# Patient Record
Sex: Male | Born: 1950 | Race: Black or African American | Hispanic: No | Smoking: Former smoker
Health system: Southern US, Community
[De-identification: ages and names within clinical notes are randomized; demographics above are authoritative.]

## PROBLEM LIST (undated history)

## (undated) DIAGNOSIS — E119 Type 2 diabetes mellitus without complications: Secondary | ICD-10-CM

## (undated) DIAGNOSIS — K219 Gastro-esophageal reflux disease without esophagitis: Secondary | ICD-10-CM

## (undated) DIAGNOSIS — M1990 Unspecified osteoarthritis, unspecified site: Secondary | ICD-10-CM

## (undated) DIAGNOSIS — H269 Unspecified cataract: Secondary | ICD-10-CM

## (undated) DIAGNOSIS — G709 Myoneural disorder, unspecified: Secondary | ICD-10-CM

## (undated) DIAGNOSIS — K635 Polyp of colon: Secondary | ICD-10-CM

## (undated) DIAGNOSIS — Z87442 Personal history of urinary calculi: Secondary | ICD-10-CM

## (undated) DIAGNOSIS — R519 Headache, unspecified: Secondary | ICD-10-CM

## (undated) DIAGNOSIS — K5790 Diverticulosis of intestine, part unspecified, without perforation or abscess without bleeding: Secondary | ICD-10-CM

## (undated) DIAGNOSIS — F419 Anxiety disorder, unspecified: Secondary | ICD-10-CM

## (undated) DIAGNOSIS — Z5189 Encounter for other specified aftercare: Secondary | ICD-10-CM

## (undated) DIAGNOSIS — R51 Headache: Secondary | ICD-10-CM

## (undated) DIAGNOSIS — T7840XA Allergy, unspecified, initial encounter: Secondary | ICD-10-CM

## (undated) DIAGNOSIS — I10 Essential (primary) hypertension: Secondary | ICD-10-CM

## (undated) HISTORY — DX: Diverticulosis of intestine, part unspecified, without perforation or abscess without bleeding: K57.90

## (undated) HISTORY — DX: Allergy, unspecified, initial encounter: T78.40XA

## (undated) HISTORY — DX: Encounter for other specified aftercare: Z51.89

## (undated) HISTORY — DX: Anxiety disorder, unspecified: F41.9

## (undated) HISTORY — DX: Polyp of colon: K63.5

## (undated) HISTORY — DX: Unspecified cataract: H26.9

## (undated) HISTORY — PX: FOOT SURGERY: SHX648

---
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
## 1997-10-26 HISTORY — PX: KIDNEY STONE SURGERY: SHX686

## 2017-12-21 ENCOUNTER — Ambulatory Visit: Payer: Self-pay | Admitting: Orthopedic Surgery

## 2017-12-22 NOTE — Patient Instructions (Addendum)
Jon Robinson.  12/22/2017   Your procedure is scheduled on: 12-30-17   Report to [facility] Main  Entrance Report to Admitting at 12:30 PM    Call this number if you have problems the morning of surgery [phone]   Remember: Do not eat food or drink liquids :After Midnight. You may have a Clear Liquid Diet from Midnight until 9:00 AM. After 9:00 AM, nothing until after mouth    CLEAR LIQUID DIET   Foods Allowed                                                                     Foods Excluded  Coffee and tea, regular and decaf                             liquids that you cannot  Plain Jell-O in any flavor                                             see through such as: Fruit ices (not with fruit pulp)                                     milk, soups, orange juice  Iced Popsicles                                    All solid food Carbonated beverages, regular and diet                                    Cranberry, grape and apple juices Sports drinks like Gatorade Lightly seasoned clear broth or consume(fat free) Sugar, honey syrup  Sample Menu Breakfast                                Lunch                                     Supper Cranberry juice                    Beef broth                            Chicken broth Jell-O                                     Grape juice                           Apple juice  Coffee or tea                        Jell-O                                      Popsicle                                                Coffee or tea                        Coffee or tea  _____________________________________________________________________      Take these medicines the morning of surgery with A SIP OF WATER: Loratadine (Allergy)                                You may not have any metal on your body including hair pins and              piercings  Do not wear jewelry, lotions, powders or deodorant             Men may shave face  and neck.   Do not bring valuables to the hospital. [place].  Contacts, dentures or bridgework may not be worn into surgery.  Leave suitcase in the car. After surgery it may be brought to your room.                  Please read over the following fact sheets you were given: _____________________________________________________________________          [facility] - Preparing for Surgery Before surgery, you can play an important role.  Because skin is not sterile, your skin needs to be as free of germs as possible.  You can reduce the number of germs on your skin by washing with CHG (chlorahexidine gluconate) soap before surgery.  CHG is an antiseptic cleaner which kills germs and bonds with the skin to continue killing germs even after washing. Please DO NOT use if you have an allergy to CHG or antibacterial soaps.  If your skin becomes reddened/irritated stop using the CHG and inform your nurse when you arrive at Short Stay. Do not shave (including legs and underarms) for at least 48 hours prior to the first CHG shower.  You may shave your face/neck. Please follow these instructions carefully:  1.  Shower with CHG Soap the night before surgery and the  morning of Surgery.  2.  If you choose to wash your hair, wash your hair first as usual with your  normal  shampoo.  3.  After you shampoo, rinse your hair and body thoroughly to remove the  shampoo.                           4.  Use CHG as you would any other liquid soap.  You can apply chg directly  to the skin and wash  Gently with a scrungie or clean washcloth.  5.  Apply the CHG Soap to your body ONLY FROM THE NECK DOWN.   Do not use on face/ open                           Wound or open sores. Avoid contact with eyes, ears mouth and genitals (private parts).                       Wash face,  Genitals (private parts) with your normal soap.             6.  Wash  thoroughly, paying special attention to the area where your surgery  will be performed.  7.  Thoroughly rinse your body with warm water from the neck down.  8.  DO NOT shower/wash with your normal soap after using and rinsing off  the CHG Soap.                9.  Pat yourself dry with a clean towel.            10.  Wear clean pajamas.            11.  Place clean sheets on your bed the night of your first shower and do not  sleep with pets. Day of Surgery : Do not apply any lotions/deodorants the morning of surgery.  Please wear clean clothes to the hospital/surgery center.  FAILURE TO FOLLOW THESE INSTRUCTIONS MAY RESULT IN THE CANCELLATION OF YOUR SURGERY PATIENT SIGNATURE_________________________________  NURSE SIGNATURE__________________________________  ________________________________________________________________________   Jon Robinson  An incentive spirometer is a tool that can help keep your lungs clear and active. This tool measures how well you are filling your lungs with each breath. Taking long deep breaths may help reverse or decrease the chance of developing breathing (pulmonary) problems (especially infection) following:  A long period of time when you are unable to move or be active. BEFORE THE PROCEDURE   If the spirometer includes an indicator to show your best effort, your nurse or respiratory therapist will set it to a desired goal.  If possible, sit up straight or lean slightly forward. Try not to slouch.  Hold the incentive spirometer in an upright position. INSTRUCTIONS FOR USE  1. Sit on the edge of your bed if possible, or sit up as far as you can in bed or on a chair. 2. Hold the incentive spirometer in an upright position. 3. Breathe out normally. 4. Place the mouthpiece in your mouth and seal your lips tightly around it. 5. Breathe in slowly and as deeply as possible, raising the piston or the ball toward the top of the column. 6. Hold your  breath for 3-5 seconds or for as long as possible. Allow the piston or ball to fall to the bottom of the column. 7. Remove the mouthpiece from your mouth and breathe out normally. 8. Rest for a few seconds and repeat Steps 1 through 7 at least 10 times every 1-2 hours when you are awake. Take your time and take a few normal breaths between deep breaths. 9. The spirometer may include an indicator to show your best effort. Use the indicator as a goal to work toward during each repetition. 10. After each set of 10 deep breaths, practice coughing to be sure your lungs are clear. If you have an incision (the cut made at the time of  surgery), support your incision when coughing by placing a pillow or rolled up towels firmly against it. Once you are able to get out of bed, walk around indoors and cough well. You may stop using the incentive spirometer when instructed by your caregiver.  RISKS AND COMPLICATIONS  Take your time so you do not get dizzy or light-headed.  If you are in pain, you may need to take or ask for pain medication before doing incentive spirometry. It is harder to take a deep breath if you are having pain. AFTER USE  Rest and breathe slowly and easily.  It can be helpful to keep track of a log of your progress. Your caregiver can provide you with a simple table to help with this. If you are using the spirometer at home, follow these instructions: [place] IF:   You are having difficultly using the spirometer.  You have trouble using the spirometer as often as instructed.  Your pain medication is not giving enough relief while using the spirometer.  You develop fever of 100.5 F (38.1 C) or higher. SEEK IMMEDIATE MEDICAL CARE IF:   You cough up bloody sputum that had not been present before.  You develop fever of 102 F (38.9 C) or greater.  You develop worsening pain at or near the incision site. MAKE SURE YOU:   Understand these instructions.  Will watch  your condition.  Will get help right away if you are not doing well or get worse. Document Released: 02/22/2007 Document Revised: 01/04/2012 Document Reviewed: 04/25/2007 Exit[employer] Patient Information 2014 [employer], [place].   ________________________________________________________________________  WHAT IS A BLOOD TRANSFUSION? Blood Transfusion Information  A transfusion is the replacement of blood or some of its parts. Blood is made up of multiple cells which provide different functions.  Red blood cells carry oxygen and are used for blood loss replacement.  White blood cells fight against infection.  Platelets control bleeding.  Plasma helps clot blood.  Other blood products are available for specialized needs, such as hemophilia or other clotting disorders. BEFORE THE TRANSFUSION  Who gives blood for transfusions?   Healthy volunteers who are fully evaluated to make sure their blood is safe. This is blood bank blood. Transfusion therapy is the safest it has ever been in the practice of medicine. Before blood is taken from a donor, a complete history is taken to make sure that person has no history of diseases nor engages in risky social behavior (examples are intravenous drug use or sexual activity with multiple partners). The donor's travel history is screened to minimize risk of transmitting infections, such as malaria. The donated blood is tested for signs of infectious diseases, such as HIV and hepatitis. The blood is then tested to be sure it is compatible with you in order to minimize the chance of a transfusion reaction. If you or a relative donates blood, this is often done in anticipation of surgery and is not appropriate for emergency situations. It takes many days to process the donated blood. RISKS AND COMPLICATIONS Although transfusion therapy is very safe and saves many lives, the main dangers of transfusion include:   Getting an infectious disease.  Developing a  transfusion reaction. This is an allergic reaction to something in the blood you were given. Every precaution is taken to prevent this. The decision to have a blood transfusion has been considered carefully by your caregiver before blood is given. Blood is not given unless the benefits outweigh the risks. AFTER THE  TRANSFUSION  Right after receiving a blood transfusion, you will usually feel much better and more energetic. This is especially true if your red blood cells have gotten low (anemic). The transfusion raises the level of the red blood cells which carry oxygen, and this usually causes an energy increase.  The nurse administering the transfusion will monitor you carefully for complications. HOME CARE INSTRUCTIONS  No special instructions are needed after a transfusion. You may find your energy is better. Speak with your caregiver about any limitations on activity for underlying diseases you may have. SEEK MEDICAL CARE IF:   Your condition is not improving after your transfusion.  You develop redness or irritation at the intravenous (IV) site. SEEK IMMEDIATE MEDICAL CARE IF:  Any of the following symptoms occur over the next 12 hours:  Shaking chills.  You have a temperature by mouth above 102 F (38.9 C), not controlled by medicine.  Chest, back, or muscle pain.  People around you feel you are not acting correctly or are confused.  Shortness of breath or difficulty breathing.  Dizziness and fainting.  You get a rash or develop hives.  You have a decrease in urine output.  Your urine turns a dark color or changes to pink, red, or brown. Any of the following symptoms occur over the next 10 days:  You have a temperature by mouth above 102 F (38.9 C), not controlled by medicine.  Shortness of breath.  Weakness after normal activity.  The white part of the eye turns yellow (jaundice).  You have a decrease in the amount of urine or are urinating less often.  Your  urine turns a dark color or changes to pink, red, or brown. Document Released: 10/09/2000 Document Revised: 01/04/2012 Document Reviewed: 05/28/2008 [facility] Patient Information 2014 [place], [place].  _______________________________________________________________________

## 2017-12-23 ENCOUNTER — Encounter (HOSPITAL_COMMUNITY)
Admission: RE | Admit: 2017-12-23 | Discharge: 2017-12-23 | Disposition: A | Discharge status: Home / Self Care | Payer: Non-veteran care | Source: Ambulatory Visit | Attending: Orthopedic Surgery | Admitting: Orthopedic Surgery

## 2017-12-23 ENCOUNTER — Other Ambulatory Visit: Payer: Self-pay

## 2017-12-23 ENCOUNTER — Encounter (HOSPITAL_COMMUNITY): Payer: Self-pay

## 2017-12-23 DIAGNOSIS — Z01812 Encounter for preprocedural laboratory examination: Secondary | ICD-10-CM | POA: Diagnosis not present

## 2017-12-23 DIAGNOSIS — Z01810 Encounter for preprocedural cardiovascular examination: Secondary | ICD-10-CM | POA: Diagnosis not present

## 2017-12-23 HISTORY — DX: Type 2 diabetes mellitus without complications: E11.9

## 2017-12-23 HISTORY — DX: Gastro-esophageal reflux disease without esophagitis: K21.9

## 2017-12-23 HISTORY — DX: Essential (primary) hypertension: I10

## 2017-12-23 HISTORY — DX: Personal history of urinary calculi: Z87.442

## 2017-12-23 HISTORY — DX: Unspecified osteoarthritis, unspecified site: M19.90

## 2017-12-23 HISTORY — DX: Headache: R51

## 2017-12-23 HISTORY — DX: Myoneural disorder, unspecified: G70.9

## 2017-12-23 HISTORY — DX: Headache, unspecified: R51.9

## 2017-12-23 LAB — ABO/RH: ABO/RH(D): O POS

## 2017-12-23 LAB — CBC
HEMATOCRIT: 40.9 % (ref 39.0–52.0)
Hemoglobin: 13 g/dL (ref 13.0–17.0)
MCH: 26.3 pg (ref 26.0–34.0)
MCHC: 31.8 g/dL (ref 30.0–36.0)
MCV: 82.8 fL (ref 78.0–100.0)
PLATELETS: 288 10*3/uL (ref 150–400)
RBC: 4.94 MIL/uL (ref 4.22–5.81)
RDW: 15.9 % — AB (ref 11.5–15.5)
WBC: 6.2 10*3/uL (ref 4.0–10.5)

## 2017-12-23 LAB — BASIC METABOLIC PANEL
Anion gap: 9 (ref 5–15)
BUN: 21 mg/dL — AB (ref 6–20)
CO2: 26 mmol/L (ref 22–32)
CREATININE: 1.16 mg/dL (ref 0.61–1.24)
Calcium: 9.3 mg/dL (ref 8.9–10.3)
Chloride: 104 mmol/L (ref 101–111)
GFR calc Af Amer: >60 mL/min (ref >60)
Glucose, Bld: 115 mg/dL — ABNORMAL HIGH (ref 65–99)
POTASSIUM: 4.1 mmol/L (ref 3.5–5.1)
SODIUM: 139 mmol/L (ref 135–145)

## 2017-12-23 LAB — SURGICAL PCR SCREEN
MRSA, PCR: NEGATIVE
STAPHYLOCOCCUS AUREUS: NEGATIVE

## 2017-12-23 LAB — HEMOGLOBIN A1C
Hgb A1c MFr Bld: 7.3 % — ABNORMAL HIGH (ref 4.8–5.6)
MEAN PLASMA GLUCOSE: 162.81 mg/dL

## 2017-12-23 NOTE — Progress Notes (Signed)
11-12-17 Surgical clearance from Dr. Beckey Downing on chart.

## 2017-12-24 ENCOUNTER — Ambulatory Visit: Payer: Self-pay | Admitting: Orthopedic Surgery

## 2017-12-24 NOTE — H&P (View-Only) (Signed)
TOTAL KNEE ADMISSION H&P  Patient is being admitted for right total knee arthroplasty.  Subjective:  Chief Complaint:right knee pain.  HPI: Jon Deloyd Handy., 67 y.o. male, has a history of pain and functional disability in the right knee due to arthritis and has failed non-surgical conservative treatments for greater than 12 weeks to includeNSAID's and/or analgesics, corticosteriod injections, flexibility and strengthening excercises, use of assistive devices, weight reduction as appropriate and activity modification.  Onset of symptoms was gradual, starting >10 years ago with gradually worsening course since that time. The patient noted no past surgery on the right knee(s).  Patient currently rates pain in the right knee(s) at 10 out of 10 with activity. Patient has night pain, worsening of pain with activity and weight bearing, pain that interferes with activities of daily living, pain with passive range of motion, crepitus and joint swelling.  Patient has evidence of subchondral cysts, subchondral sclerosis, periarticular osteophytes and joint space narrowing by imaging studies. There is no active infection.  There are no active problems to display for this patient.  Past Medical History:  Diagnosis Date  . Arthritis   . Diabetes mellitus without complication ([place])    Diet controlled  . GERD (gastroesophageal reflux disease)   . Headache   . History of kidney stones   . Hypertension   . Neuromuscular disorder ([place])    Finger tips numbness/tingling    Past Surgical History:  Procedure Laterality Date  . FRACTURE SURGERY  1972   Wrist   . KIDNEY STONE SURGERY  10/1997    No current facility-administered medications for this visit.    No current outpatient medications on file.   Facility-Administered Medications Ordered in Other Visits  Medication Dose Route Frequency Provider Last Rate Last Dose  . 0.9 %  sodium chloride infusion   Intravenous Continuous Mianna Iezzi, Aaron Edelman, MD       . acetaminophen (OFIRMEV) IV 1,000 mg  1,000 mg Intravenous To OR Nerea Bordenave, Aaron Edelman, MD      . chlorhexidine (HIBICLENS) 4 % liquid 4 application  60 mL Topical Once Itzia Cunliffe, Aaron Edelman, MD      . clindamycin (CLEOCIN) IVPB 900 mg  900 mg Intravenous On Call to [place], MD      . fentaNYL (SUBLIMAZE) injection 50-100 mcg  50-100 mcg Intravenous Madilyn Hook, MD   100 mcg at 12/30/17 1321  . lactated ringers infusion   Intravenous Continuous Roderic Palau, MD 20 mL/hr at 12/30/17 1303    . midazolam (VERSED) injection 1-2 mg  1-2 mg Intravenous Madilyn Hook, MD   2 mg at 12/30/17 1321  . ropivacaine (PF) 7.5 mg/mL (0.75%) (NAROPIN) injection   Peri-NEURAL Anesthesia Intra-op Roderic Palau, MD   20 mL at 12/30/17 1322  . tranexamic acid (CYKLOKAPRON) 1,000 mg in sodium chloride 0.9 % 100 mL IVPB  1,000 mg Intravenous To OR Ritesh Opara, Aaron Edelman, MD       Allergies  Allergen Reactions  . Novocain [Procaine] Swelling    In the face  . Penicillins Other (See Comments)    Social History   Tobacco Use  . Smoking status: Former Smoker    Packs/day: 0.50    Years: 25.00    Pack years: 12.50    Types: Cigarettes    Last attempt to quit: 12/28/2016    Years since quitting: 1.0  . Smokeless tobacco: Never Used  Substance Use Topics  . Alcohol use: Yes    Alcohol/week: 1.2 - 1.8 oz  Types: 1 Shots of liquor, 1 - 2 Cans of beer per week    Comment: Weekly    No family history on file.   Review of Systems  Constitutional: Positive for diaphoresis.  HENT: Negative.   Eyes: Negative.   Respiratory: Negative.   Cardiovascular: Negative.   Gastrointestinal: Positive for diarrhea.  Genitourinary: Positive for urgency.  Musculoskeletal: Positive for back pain and joint pain.  Skin: Negative.   Neurological: Negative.   Endo/Heme/Allergies: Positive for environmental allergies.  Psychiatric/Behavioral: The patient has insomnia.     Objective:  Physical  Exam  Vitals reviewed. Constitutional: He is oriented to person, place, and time. He appears well-developed and well-nourished.  HENT:  Head: Normocephalic and atraumatic.  Eyes: Conjunctivae and EOM are normal. Pupils are equal, round, and reactive to light.  Neck: Normal range of motion. Neck supple.  Cardiovascular: Normal rate and intact distal pulses.  Respiratory: Effort normal. No respiratory distress.  GI: Soft. He exhibits no distension.  Genitourinary:  Genitourinary Comments: deferred  Musculoskeletal:       Right knee: He exhibits decreased range of motion, swelling, effusion, abnormal alignment and bony tenderness. Tenderness found. Medial joint line and lateral joint line tenderness noted.  Neurological: He is alert and oriented to person, place, and time. He has normal reflexes.  Skin: Skin is warm and dry.  Psychiatric: He has a normal mood and affect. His behavior is normal. Judgment and thought content normal.    Vital signs in last 24 hours: @VSRANGES @  Labs:   Estimated body mass index is 31.6 kg/m as calculated from the following:   Height as of 12/30/17: 5\' 9"  (1.753 m).   Weight as of 12/30/17: 97.1 kg (214 lb).   Imaging Review Plain radiographs demonstrate severe degenerative joint disease of the right knee(s). The overall alignment issignificant valgus. The bone quality appears to be adequate for age and reported activity level.  Assessment/Plan:  End stage arthritis, right knee   The patient history, physical examination, clinical judgment of the provider and imaging studies are consistent with end stage degenerative joint disease of the right knee(s) and total knee arthroplasty is deemed medically necessary. The treatment options including medical management, injection therapy arthroscopy and arthroplasty were discussed at length. The risks and benefits of total knee arthroplasty were presented and reviewed. The risks due to aseptic loosening, infection,  stiffness, patella tracking problems, thromboembolic complications and other imponderables were discussed. The patient acknowledged the explanation, agreed to proceed with the plan and consent was signed. Patient is being admitted for inpatient treatment for surgery, pain control, PT, OT, prophylactic antibiotics, VTE prophylaxis, progressive ambulation and ADL's and discharge planning. The patient is planning to be discharged home with home health services

## 2017-12-24 NOTE — H&P (Signed)
TOTAL KNEE ADMISSION H&P  Patient is being admitted for right total knee arthroplasty.  Subjective:  Chief Complaint:right knee pain.  HPI: Jon Robinson., 67 y.o. male, has a history of pain and functional disability in the right knee due to arthritis and has failed non-surgical conservative treatments for greater than 12 weeks to includeNSAID's and/or analgesics, corticosteriod injections, flexibility and strengthening excercises, use of assistive devices, weight reduction as appropriate and activity modification.  Onset of symptoms was gradual, starting >10 years ago with gradually worsening course since that time. The patient noted no past surgery on the right knee(s).  Patient currently rates pain in the right knee(s) at 10 out of 10 with activity. Patient has night pain, worsening of pain with activity and weight bearing, pain that interferes with activities of daily living, pain with passive range of motion, crepitus and joint swelling.  Patient has evidence of subchondral cysts, subchondral sclerosis, periarticular osteophytes and joint space narrowing by imaging studies. There is no active infection.  There are no active problems to display for this patient.  Past Medical History:  Diagnosis Date  . Arthritis   . Diabetes mellitus without complication ([place])    Diet controlled  . GERD (gastroesophageal reflux disease)   . Headache   . History of kidney stones   . Hypertension   . Neuromuscular disorder ([place])    Finger tips numbness/tingling    Past Surgical History:  Procedure Laterality Date  . FRACTURE SURGERY  1972   Wrist   . KIDNEY STONE SURGERY  10/1997    No current facility-administered medications for this visit.    No current outpatient medications on file.   Facility-Administered Medications Ordered in Other Visits  Medication Dose Route Frequency Provider Last Rate Last Dose  . 0.9 %  sodium chloride infusion   Intravenous Continuous Talal Fritchman, Aaron Edelman, MD       . acetaminophen (OFIRMEV) IV 1,000 mg  1,000 mg Intravenous To OR Merced Brougham, Aaron Edelman, MD      . chlorhexidine (HIBICLENS) 4 % liquid 4 application  60 mL Topical Once Jalon Blackwelder, Aaron Edelman, MD      . clindamycin (CLEOCIN) IVPB 900 mg  900 mg Intravenous On Call to [place], MD      . fentaNYL (SUBLIMAZE) injection 50-100 mcg  50-100 mcg Intravenous Madilyn Hook, MD   100 mcg at 12/30/17 1321  . lactated ringers infusion   Intravenous Continuous Roderic Palau, MD 20 mL/hr at 12/30/17 1303    . midazolam (VERSED) injection 1-2 mg  1-2 mg Intravenous Madilyn Hook, MD   2 mg at 12/30/17 1321  . ropivacaine (PF) 7.5 mg/mL (0.75%) (NAROPIN) injection   Peri-NEURAL Anesthesia Intra-op Roderic Palau, MD   20 mL at 12/30/17 1322  . tranexamic acid (CYKLOKAPRON) 1,000 mg in sodium chloride 0.9 % 100 mL IVPB  1,000 mg Intravenous To OR Aniko Finnigan, Aaron Edelman, MD       Allergies  Allergen Reactions  . Novocain [Procaine] Swelling    In the face  . Penicillins Other (See Comments)    Social History   Tobacco Use  . Smoking status: Former Smoker    Packs/day: 0.50    Years: 25.00    Pack years: 12.50    Types: Cigarettes    Last attempt to quit: 12/28/2016    Years since quitting: 1.0  . Smokeless tobacco: Never Used  Substance Use Topics  . Alcohol use: Yes    Alcohol/week: 1.2 - 1.8 oz  Types: 1 Shots of liquor, 1 - 2 Cans of beer per week    Comment: Weekly    No family history on file.   Review of Systems  Constitutional: Positive for diaphoresis.  HENT: Negative.   Eyes: Negative.   Respiratory: Negative.   Cardiovascular: Negative.   Gastrointestinal: Positive for diarrhea.  Genitourinary: Positive for urgency.  Musculoskeletal: Positive for back pain and joint pain.  Skin: Negative.   Neurological: Negative.   Endo/Heme/Allergies: Positive for environmental allergies.  Psychiatric/Behavioral: The patient has insomnia.     Objective:  Physical  Exam  Vitals reviewed. Constitutional: He is oriented to person, place, and time. He appears well-developed and well-nourished.  HENT:  Head: Normocephalic and atraumatic.  Eyes: Conjunctivae and EOM are normal. Pupils are equal, round, and reactive to light.  Neck: Normal range of motion. Neck supple.  Cardiovascular: Normal rate and intact distal pulses.  Respiratory: Effort normal. No respiratory distress.  GI: Soft. He exhibits no distension.  Genitourinary:  Genitourinary Comments: deferred  Musculoskeletal:       Right knee: He exhibits decreased range of motion, swelling, effusion, abnormal alignment and bony tenderness. Tenderness found. Medial joint line and lateral joint line tenderness noted.  Neurological: He is alert and oriented to person, place, and time. He has normal reflexes.  Skin: Skin is warm and dry.  Psychiatric: He has a normal mood and affect. His behavior is normal. Judgment and thought content normal.    Vital signs in last 24 hours: @VSRANGES @  Labs:   Estimated body mass index is 31.6 kg/m as calculated from the following:   Height as of 12/30/17: 5\' 9"  (1.753 m).   Weight as of 12/30/17: 97.1 kg (214 lb).   Imaging Review Plain radiographs demonstrate severe degenerative joint disease of the right knee(s). The overall alignment issignificant valgus. The bone quality appears to be adequate for age and reported activity level.  Assessment/Plan:  End stage arthritis, right knee   The patient history, physical examination, clinical judgment of the provider and imaging studies are consistent with end stage degenerative joint disease of the right knee(s) and total knee arthroplasty is deemed medically necessary. The treatment options including medical management, injection therapy arthroscopy and arthroplasty were discussed at length. The risks and benefits of total knee arthroplasty were presented and reviewed. The risks due to aseptic loosening, infection,  stiffness, patella tracking problems, thromboembolic complications and other imponderables were discussed. The patient acknowledged the explanation, agreed to proceed with the plan and consent was signed. Patient is being admitted for inpatient treatment for surgery, pain control, PT, OT, prophylactic antibiotics, VTE prophylaxis, progressive ambulation and ADL's and discharge planning. The patient is planning to be discharged home with home health services

## 2017-12-27 NOTE — Progress Notes (Signed)
Final ekg in epic 

## 2017-12-29 MED ORDER — TRANEXAMIC ACID 1000 MG/10ML IV SOLN
1000.0000 mg | INTRAVENOUS | Status: AC
  Administered 2017-12-30: 1000 mg via INTRAVENOUS
  Filled 2017-12-29: qty 1100

## 2017-12-30 ENCOUNTER — Inpatient Hospital Stay (HOSPITAL_COMMUNITY)
Admission: RE | Admit: 2017-12-30 | Discharge: 2018-01-01 | DRG: 470 | Disposition: A | Discharge status: Home / Self Care | Payer: Non-veteran care | Source: Ambulatory Visit | Attending: Orthopedic Surgery | Admitting: Orthopedic Surgery

## 2017-12-30 ENCOUNTER — Inpatient Hospital Stay (HOSPITAL_COMMUNITY): Payer: Non-veteran care

## 2017-12-30 ENCOUNTER — Inpatient Hospital Stay (HOSPITAL_COMMUNITY): Payer: Non-veteran care | Admitting: Certified Registered Nurse Anesthetist

## 2017-12-30 ENCOUNTER — Encounter (HOSPITAL_COMMUNITY)
Admission: RE | Disposition: A | Discharge status: Home / Self Care | Payer: Self-pay | Source: Ambulatory Visit | Attending: Orthopedic Surgery

## 2017-12-30 ENCOUNTER — Encounter (HOSPITAL_COMMUNITY): Payer: Self-pay | Admitting: *Deleted

## 2017-12-30 ENCOUNTER — Other Ambulatory Visit: Payer: Self-pay

## 2017-12-30 DIAGNOSIS — I10 Essential (primary) hypertension: Secondary | ICD-10-CM | POA: Diagnosis present

## 2017-12-30 DIAGNOSIS — E119 Type 2 diabetes mellitus without complications: Secondary | ICD-10-CM | POA: Diagnosis present

## 2017-12-30 DIAGNOSIS — Z884 Allergy status to anesthetic agent status: Secondary | ICD-10-CM

## 2017-12-30 DIAGNOSIS — K219 Gastro-esophageal reflux disease without esophagitis: Secondary | ICD-10-CM | POA: Diagnosis present

## 2017-12-30 DIAGNOSIS — M1711 Unilateral primary osteoarthritis, right knee: Secondary | ICD-10-CM | POA: Diagnosis present

## 2017-12-30 DIAGNOSIS — Z880 Allergy status to penicillin: Secondary | ICD-10-CM | POA: Diagnosis not present

## 2017-12-30 DIAGNOSIS — Z87891 Personal history of nicotine dependence: Secondary | ICD-10-CM

## 2017-12-30 DIAGNOSIS — M659 Synovitis and tenosynovitis, unspecified: Secondary | ICD-10-CM | POA: Diagnosis present

## 2017-12-30 DIAGNOSIS — Z96651 Presence of right artificial knee joint: Secondary | ICD-10-CM

## 2017-12-30 HISTORY — PX: KNEE ARTHROPLASTY: SHX992

## 2017-12-30 LAB — GLUCOSE, CAPILLARY
GLUCOSE-CAPILLARY: 206 mg/dL — AB (ref 65–99)
Glucose-Capillary: 132 mg/dL — ABNORMAL HIGH (ref 65–99)

## 2017-12-30 LAB — TYPE AND SCREEN
ABO/RH(D): O POS
Antibody Screen: NEGATIVE

## 2017-12-30 SURGERY — ARTHROPLASTY, KNEE, TOTAL, USING IMAGELESS COMPUTER-ASSISTED NAVIGATION
Anesthesia: Regional | Site: Knee | Laterality: Right

## 2017-12-30 MED ORDER — KETOROLAC TROMETHAMINE 30 MG/ML IJ SOLN
INTRAMUSCULAR | Status: AC
  Filled 2017-12-30: qty 1

## 2017-12-30 MED ORDER — ACETAMINOPHEN 10 MG/ML IV SOLN
1000.0000 mg | INTRAVENOUS | Status: AC
  Administered 2017-12-30: 1000 mg via INTRAVENOUS
  Filled 2017-12-30: qty 100

## 2017-12-30 MED ORDER — SODIUM CHLORIDE 0.9 % IV SOLN: INTRAVENOUS | Status: DC

## 2017-12-30 MED ORDER — OXYCODONE HCL 5 MG PO TABS
10.0000 mg | ORAL_TABLET | ORAL | Status: DC | PRN
  Administered 2018-01-01: 12:00:00 15 mg via ORAL
  Administered 2018-01-01: 10 mg via ORAL
  Filled 2017-12-30 (×2): qty 2
  Filled 2017-12-30: qty 3

## 2017-12-30 MED ORDER — LABETALOL HCL 5 MG/ML IV SOLN
5.0000 mg | INTRAVENOUS | Status: DC | PRN
  Administered 2017-12-30 (×3): 5 mg via INTRAVENOUS

## 2017-12-30 MED ORDER — LABETALOL HCL 5 MG/ML IV SOLN
INTRAVENOUS | Status: AC
  Administered 2017-12-30: 5 mg via INTRAVENOUS
  Filled 2017-12-30: qty 4

## 2017-12-30 MED ORDER — DEXAMETHASONE SODIUM PHOSPHATE 10 MG/ML IJ SOLN
10.0000 mg | Freq: Once | INTRAMUSCULAR | Status: AC
  Administered 2017-12-31: 10 mg via INTRAVENOUS
  Filled 2017-12-30: qty 1

## 2017-12-30 MED ORDER — HYDROCHLOROTHIAZIDE 25 MG PO TABS
25.0000 mg | ORAL_TABLET | Freq: Every day | ORAL | Status: DC
  Administered 2017-12-31 – 2018-01-01 (×2): 25 mg via ORAL
  Filled 2017-12-30 (×2): qty 1

## 2017-12-30 MED ORDER — ONDANSETRON HCL 4 MG/2ML IJ SOLN
INTRAMUSCULAR | Status: DC | PRN
  Administered 2017-12-30 (×2): 4 mg via INTRAVENOUS

## 2017-12-30 MED ORDER — FENTANYL CITRATE (PF) 100 MCG/2ML IJ SOLN
INTRAMUSCULAR | Status: DC | PRN
  Administered 2017-12-30 (×2): 25 ug via INTRAVENOUS
  Administered 2017-12-30 (×3): 50 ug via INTRAVENOUS

## 2017-12-30 MED ORDER — MUSCLE RUB 10-15 % EX CREA
TOPICAL_CREAM | Freq: Two times a day (BID) | CUTANEOUS | Status: DC | PRN
  Filled 2017-12-30: qty 85

## 2017-12-30 MED ORDER — DIPHENHYDRAMINE HCL 12.5 MG/5ML PO ELIX: 12.5000 mg | ORAL_SOLUTION | ORAL | Status: DC | PRN

## 2017-12-30 MED ORDER — CLINDAMYCIN PHOSPHATE 900 MG/50ML IV SOLN
900.0000 mg | INTRAVENOUS | Status: AC
  Administered 2017-12-30: 900 mg via INTRAVENOUS
  Filled 2017-12-30: qty 50

## 2017-12-30 MED ORDER — POLYETHYLENE GLYCOL 3350 17 G PO PACK: 17.0000 g | PACK | Freq: Every day | ORAL | Status: DC | PRN

## 2017-12-30 MED ORDER — KETAMINE HCL 10 MG/ML IJ SOLN
INTRAMUSCULAR | Status: DC | PRN
  Administered 2017-12-30 (×2): 20 mg via INTRAVENOUS
  Administered 2017-12-30: 30 mg via INTRAVENOUS

## 2017-12-30 MED ORDER — BUPIVACAINE-EPINEPHRINE 0.25% -1:200000 IJ SOLN
INTRAMUSCULAR | Status: DC | PRN
  Administered 2017-12-30: 30 mL

## 2017-12-30 MED ORDER — KETOROLAC TROMETHAMINE 30 MG/ML IJ SOLN
INTRAMUSCULAR | Status: DC | PRN
  Administered 2017-12-30: 30 mg via INTRA_ARTICULAR

## 2017-12-30 MED ORDER — HYDROMORPHONE HCL 1 MG/ML IJ SOLN
INTRAMUSCULAR | Status: DC | PRN
  Administered 2017-12-30 (×4): 0.5 mg via INTRAVENOUS

## 2017-12-30 MED ORDER — VITAMIN C 500 MG PO TABS
1000.0000 mg | ORAL_TABLET | Freq: Every day | ORAL | Status: DC
  Administered 2017-12-31 – 2018-01-01 (×2): 1000 mg via ORAL
  Filled 2017-12-30 (×2): qty 2

## 2017-12-30 MED ORDER — ONDANSETRON HCL 4 MG PO TABS: 4.0000 mg | ORAL_TABLET | Freq: Four times a day (QID) | ORAL | Status: DC | PRN

## 2017-12-30 MED ORDER — OXYCODONE HCL 5 MG PO TABS
5.0000 mg | ORAL_TABLET | ORAL | Status: DC | PRN
  Administered 2017-12-31 – 2018-01-01 (×3): 10 mg via ORAL
  Filled 2017-12-30 (×2): qty 2

## 2017-12-30 MED ORDER — HYDROMORPHONE HCL 1 MG/ML IJ SOLN
INTRAMUSCULAR | Status: AC
  Administered 2017-12-30: 0.5 mg via INTRAVENOUS
  Filled 2017-12-30: qty 2

## 2017-12-30 MED ORDER — SODIUM CHLORIDE 0.9 % IJ SOLN
INTRAMUSCULAR | Status: AC
  Filled 2017-12-30: qty 50

## 2017-12-30 MED ORDER — HYDRALAZINE HCL 20 MG/ML IJ SOLN
INTRAMUSCULAR | Status: AC
  Administered 2017-12-30: 5 mg via INTRAVENOUS
  Filled 2017-12-30: qty 1

## 2017-12-30 MED ORDER — HYDRALAZINE HCL 20 MG/ML IJ SOLN
5.0000 mg | Freq: Once | INTRAMUSCULAR | Status: AC
  Administered 2017-12-30: 5 mg via INTRAVENOUS

## 2017-12-30 MED ORDER — KETOROLAC TROMETHAMINE 15 MG/ML IJ SOLN
15.0000 mg | Freq: Four times a day (QID) | INTRAMUSCULAR | Status: AC
  Administered 2017-12-30 – 2017-12-31 (×4): 15 mg via INTRAVENOUS
  Filled 2017-12-30 (×4): qty 1

## 2017-12-30 MED ORDER — METOCLOPRAMIDE HCL 5 MG/ML IJ SOLN: 5.0000 mg | Freq: Three times a day (TID) | INTRAMUSCULAR | Status: DC | PRN

## 2017-12-30 MED ORDER — SUCCINYLCHOLINE CHLORIDE 20 MG/ML IJ SOLN
INTRAMUSCULAR | Status: DC | PRN
  Administered 2017-12-30: 100 mg via INTRAVENOUS

## 2017-12-30 MED ORDER — HYDROMORPHONE HCL 2 MG/ML IJ SOLN
INTRAMUSCULAR | Status: AC
  Filled 2017-12-30: qty 1

## 2017-12-30 MED ORDER — BUPIVACAINE HCL (PF) 0.25 % IJ SOLN
INTRAMUSCULAR | Status: AC
  Filled 2017-12-30: qty 30

## 2017-12-30 MED ORDER — FENTANYL CITRATE (PF) 100 MCG/2ML IJ SOLN
50.0000 ug | INTRAMUSCULAR | Status: DC
  Administered 2017-12-30: 100 ug via INTRAVENOUS
  Filled 2017-12-30: qty 2

## 2017-12-30 MED ORDER — SODIUM CHLORIDE 0.9 % IJ SOLN
INTRAMUSCULAR | Status: DC | PRN
  Administered 2017-12-30: 30 mL

## 2017-12-30 MED ORDER — ISOPROPYL ALCOHOL 70 % SOLN
Status: DC | PRN
  Administered 2017-12-30: 1 via TOPICAL

## 2017-12-30 MED ORDER — KETAMINE HCL 10 MG/ML IJ SOLN
INTRAMUSCULAR | Status: AC
  Filled 2017-12-30: qty 1

## 2017-12-30 MED ORDER — MENTHOL 3 MG MT LOZG: 1.0000 | LOZENGE | OROMUCOSAL | Status: DC | PRN

## 2017-12-30 MED ORDER — ALBUTEROL SULFATE HFA 108 (90 BASE) MCG/ACT IN AERS
INHALATION_SPRAY | RESPIRATORY_TRACT | Status: DC | PRN
  Administered 2017-12-30: 2 via RESPIRATORY_TRACT

## 2017-12-30 MED ORDER — PHENOL 1.4 % MT LIQD
1.0000 | OROMUCOSAL | Status: DC | PRN
  Filled 2017-12-30: qty 177

## 2017-12-30 MED ORDER — HYDROMORPHONE HCL 1 MG/ML IJ SOLN
0.2500 mg | INTRAMUSCULAR | Status: DC | PRN
  Administered 2017-12-30 (×2): 0.5 mg via INTRAVENOUS

## 2017-12-30 MED ORDER — FENTANYL CITRATE (PF) 250 MCG/5ML IJ SOLN
INTRAMUSCULAR | Status: AC
  Filled 2017-12-30: qty 5

## 2017-12-30 MED ORDER — DEXAMETHASONE SODIUM PHOSPHATE 10 MG/ML IJ SOLN
INTRAMUSCULAR | Status: AC
  Filled 2017-12-30: qty 1

## 2017-12-30 MED ORDER — ONDANSETRON HCL 4 MG/2ML IJ SOLN
INTRAMUSCULAR | Status: AC
  Filled 2017-12-30: qty 2

## 2017-12-30 MED ORDER — ALUM & MAG HYDROXIDE-SIMETH 200-200-20 MG/5ML PO SUSP: 30.0000 mL | ORAL | Status: DC | PRN

## 2017-12-30 MED ORDER — DOCUSATE SODIUM 100 MG PO CAPS
100.0000 mg | ORAL_CAPSULE | Freq: Two times a day (BID) | ORAL | Status: DC
  Administered 2017-12-30 – 2018-01-01 (×4): 100 mg via ORAL
  Filled 2017-12-30 (×4): qty 1

## 2017-12-30 MED ORDER — PROPOFOL 10 MG/ML IV BOLUS
INTRAVENOUS | Status: DC | PRN
  Administered 2017-12-30 (×2): 30 mg via INTRAVENOUS
  Administered 2017-12-30: 200 mg via INTRAVENOUS
  Administered 2017-12-30 (×2): 20 mg via INTRAVENOUS

## 2017-12-30 MED ORDER — LACTATED RINGERS IV SOLN
INTRAVENOUS | Status: DC
  Administered 2017-12-30 (×2): via INTRAVENOUS

## 2017-12-30 MED ORDER — MIDAZOLAM HCL 2 MG/2ML IJ SOLN
1.0000 mg | INTRAMUSCULAR | Status: DC
  Administered 2017-12-30: 2 mg via INTRAVENOUS
  Filled 2017-12-30: qty 2

## 2017-12-30 MED ORDER — SODIUM CHLORIDE 0.9 % IR SOLN
Status: DC | PRN
  Administered 2017-12-30: 1000 mL

## 2017-12-30 MED ORDER — SODIUM CHLORIDE 0.9 % IV SOLN
INTRAVENOUS | Status: DC
  Administered 2017-12-30: 22:00:00 via INTRAVENOUS

## 2017-12-30 MED ORDER — PROPOFOL 10 MG/ML IV BOLUS
INTRAVENOUS | Status: AC
  Filled 2017-12-30: qty 40

## 2017-12-30 MED ORDER — METOCLOPRAMIDE HCL 5 MG PO TABS: 5.0000 mg | ORAL_TABLET | Freq: Three times a day (TID) | ORAL | Status: DC | PRN

## 2017-12-30 MED ORDER — ONDANSETRON HCL 4 MG/2ML IJ SOLN: 4.0000 mg | Freq: Four times a day (QID) | INTRAMUSCULAR | Status: DC | PRN

## 2017-12-30 MED ORDER — ACETAMINOPHEN 325 MG PO TABS: 325.0000 mg | ORAL_TABLET | Freq: Four times a day (QID) | ORAL | Status: DC | PRN

## 2017-12-30 MED ORDER — ISOPROPYL ALCOHOL 70 % SOLN
Status: AC
  Filled 2017-12-30: qty 480

## 2017-12-30 MED ORDER — POVIDONE-IODINE 10 % EX SWAB
2.0000 "application " | Freq: Once | CUTANEOUS | Status: AC
  Administered 2017-12-30: 2 via TOPICAL

## 2017-12-30 MED ORDER — METHOCARBAMOL 500 MG PO TABS
500.0000 mg | ORAL_TABLET | Freq: Four times a day (QID) | ORAL | Status: DC | PRN
  Filled 2017-12-30: qty 1

## 2017-12-30 MED ORDER — PHENYLEPHRINE HCL 10 MG/ML IJ SOLN
INTRAMUSCULAR | Status: DC | PRN
  Administered 2017-12-30 (×5): 80 ug via INTRAVENOUS
  Administered 2017-12-30: 40 ug via INTRAVENOUS

## 2017-12-30 MED ORDER — CLINDAMYCIN PHOSPHATE 600 MG/50ML IV SOLN
600.0000 mg | Freq: Four times a day (QID) | INTRAVENOUS | Status: AC
  Administered 2017-12-30 – 2017-12-31 (×2): 600 mg via INTRAVENOUS
  Filled 2017-12-30 (×2): qty 50

## 2017-12-30 MED ORDER — TROLAMINE SALICYLATE 10 % EX CREA
TOPICAL_CREAM | Freq: Two times a day (BID) | CUTANEOUS | Status: DC | PRN
  Filled 2017-12-30: qty 85

## 2017-12-30 MED ORDER — HYDROMORPHONE HCL 1 MG/ML IJ SOLN: 0.5000 mg | INTRAMUSCULAR | Status: DC | PRN

## 2017-12-30 MED ORDER — METHOCARBAMOL 1000 MG/10ML IJ SOLN
500.0000 mg | Freq: Four times a day (QID) | INTRAMUSCULAR | Status: DC | PRN
  Administered 2017-12-30: 500 mg via INTRAVENOUS
  Filled 2017-12-30: qty 550

## 2017-12-30 MED ORDER — METHOCARBAMOL 500 MG PO TABS
500.0000 mg | ORAL_TABLET | Freq: Four times a day (QID) | ORAL | Status: DC | PRN
  Administered 2018-01-01: 500 mg via ORAL

## 2017-12-30 MED ORDER — VITAMIN B-6 100 MG PO TABS
200.0000 mg | ORAL_TABLET | Freq: Every day | ORAL | Status: DC
  Administered 2017-12-31 – 2018-01-01 (×2): 200 mg via ORAL
  Filled 2017-12-30 (×3): qty 2

## 2017-12-30 MED ORDER — TROLAMINE SALICYLATE 10 % EX LOTN: 1.0000 "application " | TOPICAL_LOTION | Freq: Two times a day (BID) | CUTANEOUS | Status: DC | PRN

## 2017-12-30 MED ORDER — DEXAMETHASONE SODIUM PHOSPHATE 10 MG/ML IJ SOLN
INTRAMUSCULAR | Status: DC | PRN
  Administered 2017-12-30: 10 mg via INTRAVENOUS

## 2017-12-30 MED ORDER — LORATADINE 10 MG PO TABS
10.0000 mg | ORAL_TABLET | Freq: Every day | ORAL | Status: DC
  Administered 2017-12-31 – 2018-01-01 (×2): 10 mg via ORAL
  Filled 2017-12-30 (×2): qty 1

## 2017-12-30 MED ORDER — ROPIVACAINE HCL 7.5 MG/ML IJ SOLN
INTRAMUSCULAR | Status: DC | PRN
  Administered 2017-12-30: 20 mL via PERINEURAL

## 2017-12-30 MED ORDER — ASPIRIN 81 MG PO CHEW
81.0000 mg | CHEWABLE_TABLET | Freq: Two times a day (BID) | ORAL | Status: DC
  Administered 2017-12-30 – 2018-01-01 (×4): 81 mg via ORAL
  Filled 2017-12-30 (×4): qty 1

## 2017-12-30 MED ORDER — OXYCODONE HCL ER 10 MG PO T12A
10.0000 mg | EXTENDED_RELEASE_TABLET | Freq: Two times a day (BID) | ORAL | Status: DC
  Administered 2017-12-30 – 2018-01-01 (×4): 10 mg via ORAL
  Filled 2017-12-30 (×4): qty 1

## 2017-12-30 MED ORDER — CHLORHEXIDINE GLUCONATE 4 % EX LIQD: 60.0000 mL | Freq: Once | CUTANEOUS | Status: DC

## 2017-12-30 SURGICAL SUPPLY — 59 items
BAG ZIPLOCK 12X15 (MISCELLANEOUS) IMPLANT
BANDAGE ACE 4X5 VEL STRL LF (GAUZE/BANDAGES/DRESSINGS) ×2 IMPLANT
BANDAGE ACE 6X5 VEL STRL LF (GAUZE/BANDAGES/DRESSINGS) ×2 IMPLANT
BLADE SAW RECIPROCATING 77.5 (BLADE) ×2 IMPLANT
CHLORAPREP W/TINT 26ML (MISCELLANEOUS) ×4 IMPLANT
COMPONENT TRI CR RETAIN SZ6 RT (Miscellaneous) ×1 IMPLANT
COVER SURGICAL LIGHT HANDLE (MISCELLANEOUS) ×2 IMPLANT
CUFF TOURN SGL QUICK 34 (TOURNIQUET CUFF) ×1
CUFF TRNQT CYL 34X4X40X1 (TOURNIQUET CUFF) ×1 IMPLANT
DECANTER SPIKE VIAL GLASS SM (MISCELLANEOUS) ×4 IMPLANT
DERMABOND ADVANCED (GAUZE/BANDAGES/DRESSINGS) ×2
DERMABOND ADVANCED .7 DNX12 (GAUZE/BANDAGES/DRESSINGS) ×2 IMPLANT
DRAPE SHEET LG 3/4 BI-LAMINATE (DRAPES) ×4 IMPLANT
DRAPE U-SHAPE 47X51 STRL (DRAPES) ×2 IMPLANT
DRSG AQUACEL AG ADV 3.5X10 (GAUZE/BANDAGES/DRESSINGS) ×2 IMPLANT
DRSG AQUACEL AG ADV 3.5X14 (GAUZE/BANDAGES/DRESSINGS) ×2 IMPLANT
DRSG TEGADERM 4X4.75 (GAUZE/BANDAGES/DRESSINGS) IMPLANT
ELECT BLADE TIP CTD 4 INCH (ELECTRODE) ×2 IMPLANT
ELECT REM PT RETURN 15FT ADLT (MISCELLANEOUS) ×2 IMPLANT
EVACUATOR 1/8 PVC DRAIN (DRAIN) IMPLANT
GAUZE SPONGE 4X4 12PLY STRL (GAUZE/BANDAGES/DRESSINGS) ×2 IMPLANT
GLOVE BIO SURGEON STRL SZ8.5 (GLOVE) ×4 IMPLANT
GLOVE BIOGEL PI IND STRL 8.5 (GLOVE) ×1 IMPLANT
GLOVE BIOGEL PI INDICATOR 8.5 (GLOVE) ×1
GOWN SPEC L3 XXLG W/TWL (GOWN DISPOSABLE) ×2 IMPLANT
HANDPIECE INTERPULSE COAX TIP (DISPOSABLE) ×1
HOOD PEEL AWAY FLYTE STAYCOOL (MISCELLANEOUS) ×8 IMPLANT
KNEE INSERT TIB BRG 6 (Insert) ×2 IMPLANT
KNEE TIBIAL COMPONENT SZ6 (Knees) ×2 IMPLANT
MARKER SKIN DUAL TIP RULER LAB (MISCELLANEOUS) ×2 IMPLANT
NEEDLE SPNL 18GX3.5 QUINCKE PK (NEEDLE) ×2 IMPLANT
NS IRRIG 1000ML POUR BTL (IV SOLUTION) ×2 IMPLANT
PACK TOTAL KNEE CUSTOM (KITS) ×2 IMPLANT
PADDING CAST COTTON 6X4 STRL (CAST SUPPLIES) ×2 IMPLANT
PATELLA ASYMMETRIC 38X11 KNEE (Orthopedic Implant) ×2 IMPLANT
POSITIONER SURGICAL ARM (MISCELLANEOUS) ×2 IMPLANT
SAW OSC TIP CART 19.5X105X1.3 (SAW) ×2 IMPLANT
SEALER BIPOLAR AQUA 6.0 (INSTRUMENTS) ×2 IMPLANT
SET HNDPC FAN SPRY TIP SCT (DISPOSABLE) ×1 IMPLANT
SET PAD KNEE POSITIONER (MISCELLANEOUS) ×2 IMPLANT
SPONGE DRAIN TRACH 4X4 STRL 2S (GAUZE/BANDAGES/DRESSINGS) IMPLANT
SPONGE LAP 18X18 X RAY DECT (DISPOSABLE) ×2 IMPLANT
SUCTION FRAZIER HANDLE 12FR (TUBING) ×1
SUCTION TUBE FRAZIER 12FR DISP (TUBING) ×1 IMPLANT
SUT MNCRL AB 3-0 PS2 18 (SUTURE) ×2 IMPLANT
SUT MON AB 2-0 CT1 36 (SUTURE) ×4 IMPLANT
SUT STRATAFIX PDO 1 14 VIOLET (SUTURE) ×1
SUT STRATFX PDO 1 14 VIOLET (SUTURE) ×1
SUT VIC AB 1 CT1 36 (SUTURE) ×6 IMPLANT
SUT VIC AB 2-0 CT1 27 (SUTURE) ×1
SUT VIC AB 2-0 CT1 TAPERPNT 27 (SUTURE) ×1 IMPLANT
SUTURE STRATFX PDO 1 14 VIOLET (SUTURE) ×1 IMPLANT
SYR 50ML LL SCALE MARK (SYRINGE) IMPLANT
TOWER CARTRIDGE SMART MIX (DISPOSABLE) IMPLANT
TRAY FOLEY W/METER SILVER 16FR (SET/KITS/TRAYS/PACK) ×2 IMPLANT
TRIATHLON CRUCIATE RETAIN SZ6 (Miscellaneous) ×2 IMPLANT
WATER STERILE IRR 1000ML POUR (IV SOLUTION) ×4 IMPLANT
WRAP KNEE MAXI GEL POST OP (GAUZE/BANDAGES/DRESSINGS) ×2 IMPLANT
YANKAUER SUCT BULB TIP 10FT TU (MISCELLANEOUS) ×2 IMPLANT

## 2017-12-30 NOTE — Anesthesia Procedure Notes (Signed)
Procedure Name: Intubation Date/Time: 12/30/2017 3:56 PM Performed by: West Pugh, CRNA Pre-anesthesia Checklist: Patient identified, Emergency Drugs available, Suction available, Patient being monitored and Timeout performed Patient Re-evaluated:Patient Re-evaluated prior to induction Oxygen Delivery Method: Circle system utilized Preoxygenation: Pre-oxygenation with 100% oxygen Induction Type: Cricoid Pressure applied Laryngoscope Size: Mac and 4 Grade View: Grade II Tube type: Oral Number of attempts: 2 (cords not fully open.) Secured at: 23 cm Tube secured with: Tape Dental Injury: Teeth and Oropharynx as per pre-operative assessment  Comments: Patient having large air leak that could not be remedy by repositioning. Converted to OETT. Dr Ola Spurr at bedside.

## 2017-12-30 NOTE — Discharge Instructions (Signed)
° °Dr. Selenne Coggin °Total Joint Specialist °[facility] °[address] °[place], [place] [address] °[phone] ° °TOTAL KNEE REPLACEMENT POSTOPERATIVE DIRECTIONS ° ° ° °Knee Rehabilitation, Guidelines Following Surgery  °Results after knee surgery are often greatly improved when you follow the exercise, range of motion and muscle strengthening exercises prescribed by your doctor. Safety measures are also important to protect the knee from further injury. Any time any of these exercises cause you to have increased pain or swelling in your knee joint, decrease the amount until you are comfortable again and slowly increase them. If you have problems or questions, call your caregiver or physical therapist for advice.  ° °WEIGHT BEARING °Weight bearing as tolerated with assist device (walker, cane, etc) as directed, use it as long as suggested by your surgeon or therapist, typically at least 4-6 weeks. ° °HOME CARE INSTRUCTIONS  °Remove items at home which could result in a fall. This includes throw rugs or furniture in walking pathways.  °Continue medications as instructed at time of discharge. °You may have some home medications which will be placed on hold until you complete the course of blood thinner medication.  °You may start showering once you are discharged home but do not submerge the incision under water. Just pat the incision dry and apply a dry gauze dressing on daily. °Walk with walker as instructed.  °You may resume a sexual relationship in one month or when given the OK by your doctor.  °· Use walker as long as suggested by your caregivers. °· Avoid periods of inactivity such as sitting longer than an hour when not asleep. This helps prevent blood clots.  °You may put full weight on your legs and walk as much as is comfortable.  °You may return to work once you are cleared by your doctor.  °Do not drive a car for 6 weeks or until released by you surgeon.  °· Do not drive  while taking narcotics.  °Wear the elastic stockings for three weeks following surgery during the day but you may remove then at night. °Make sure you keep all of your appointments after your operation with all of your doctors and caregivers. You should call the office at the above phone number and make an appointment for approximately two weeks after the date of your surgery. °Do not remove your surgical dressing. The dressing is waterproof; you may take showers in 3 days, but do not take tub baths or submerge the dressing. °Please pick up a stool softener and laxative for home use as long as you are requiring pain medications. °· ICE to the affected knee every three hours for 30 minutes at a time and then as needed for pain and swelling.  Continue to use ice on the knee for pain and swelling from surgery. You may notice swelling that will progress down to the foot and ankle.  This is normal after surgery.  Elevate the leg when you are not up walking on it.   °It is important for you to complete the blood thinner medication as prescribed by your doctor. °· Continue to use the breathing machine which will help keep your temperature down.  It is common for your temperature to cycle up and down following surgery, especially at night when you are not up moving around and exerting yourself.  The breathing machine keeps your lungs expanded and your temperature down. ° °RANGE OF MOTION AND STRENGTHENING EXERCISES  °Rehabilitation of the knee is important following   a knee injury or an operation. After just a few days of immobilization, the muscles of the thigh which control the knee become weakened and shrink (atrophy). Knee exercises are designed to build up the tone and strength of the thigh muscles and to improve knee motion. Often times heat used for twenty to thirty minutes before working out will loosen up your tissues and help with improving the range of motion but do not use heat for the first two weeks following  surgery. These exercises can be done on a training (exercise) mat, on the floor, on a table or on a bed. Use what ever works the best and is most comfortable for you Knee exercises include:  °Leg Lifts - While your knee is still immobilized in a splint or cast, you can do straight leg raises. Lift the leg to 60 degrees, hold for 3 sec, and slowly lower the leg. Repeat 10-20 times 2-3 times daily. Perform this exercise against resistance later as your knee gets better.  °Quad and Hamstring Sets - Tighten up the muscle on the front of the thigh (Quad) and hold for 5-10 sec. Repeat this 10-20 times hourly. Hamstring sets are done by pushing the foot backward against an object and holding for 5-10 sec. Repeat as with quad sets.  °A rehabilitation program following serious knee injuries can speed recovery and prevent re-injury in the future due to weakened muscles. Contact your doctor or a physical therapist for more information on knee rehabilitation.  ° °SKILLED REHAB INSTRUCTIONS: °If the patient is transferred to a skilled rehab facility following release from the hospital, a list of the current medications will be sent to the facility for the patient to continue.  When discharged from the skilled rehab facility, please have the facility set up the patient's Home Health Physical Therapy prior to being released. Also, the skilled facility will be responsible for providing the patient with their medications at time of release from the facility to include their pain medication, the muscle relaxants, and their blood thinner medication. If the patient is still at the rehab facility at time of the two week follow up appointment, the skilled rehab facility will also need to assist the patient in arranging follow up appointment in our office and any transportation needs. ° °MAKE SURE YOU:  °Understand these instructions.  °Will watch your condition.  °Will get help right away if you are not doing well or get worse.   ° ° °Pick up stool softner and laxative for home use following surgery while on pain medications. °Do NOT remove your dressing. You may shower.  °Do not take tub baths or submerge incision under water. °May shower starting three days after surgery. °Please use a clean towel to pat the incision dry following showers. °Continue to use ice for pain and swelling after surgery. °Do not use any lotions or creams on the incision until instructed by your surgeon. ° °

## 2017-12-30 NOTE — Anesthesia Postprocedure Evaluation (Signed)
Anesthesia Post Note  Patient: Jon Robinson.  Procedure(s) Performed: RIGHT TOTAL KNEE ARTHROPLASTY (Right Knee)     Patient location during evaluation: PACU Anesthesia Type: Regional and General Level of consciousness: awake and alert Pain management: pain level controlled Vital Signs Assessment: post-procedure vital signs reviewed and stable Respiratory status: spontaneous breathing, nonlabored ventilation, respiratory function stable and patient connected to nasal cannula oxygen Cardiovascular status: blood pressure returned to baseline and stable Postop Assessment: no apparent nausea or vomiting Anesthetic complications: no    Last Vitals:  Vitals:   12/30/17 1830 12/30/17 1845  BP: (!) 175/93   Pulse: 75 (P) 71  Resp: 15   Temp:    SpO2: 100% (P) 100%    Last Pain:  Vitals:   12/30/17 1815  TempSrc:   PainSc: 0-No pain                 Maxwell Martorano,W. EDMOND

## 2017-12-30 NOTE — Progress Notes (Signed)
AssistedDr. Edmond Fitzgerald with right, ultrasound guided, adductor canal block. Side rails up, monitors on throughout procedure. See vital signs in flow sheet. Tolerated Procedure well.  

## 2017-12-30 NOTE — Op Note (Signed)
OPERATIVE REPORT  SURGEON: Rod Can, MD   ASSISTANT: Nehemiah Massed, PA-C.  PREOPERATIVE DIAGNOSIS: Right knee arthritis with severe stiffness.   POSTOPERATIVE DIAGNOSIS: Right knee arthritis with severe stiffness.   PROCEDURE: Right total knee arthroplasty.   IMPLANTS: Stryker Triathlon CR femur, size 6. Stryker Tritanium tibia, size 6. X3 polyethelyene insert, size 9 mm, CR. 3 button asymmetric patella, size 38 mm.  ANESTHESIA:  General, Regional and Spinal  TOURNIQUET TIME: Not utilized.   ESTIMATED BLOOD LOSS:-250 mL    ANTIBIOTICS: 900 mg clindamycin.  DRAINS: None.  COMPLICATIONS: None   CONDITION: PACU - hemodynamically stable.   BRIEF CLINICAL NOTE: Jon Robinson. is a 67 y.o. male with a long-standing history of Right knee arthritis and severe stiffness. After failing conservative management, the patient was indicated for total knee arthroplasty. The risks, benefits, and alternatives to the procedure were explained, and the patient elected to proceed.  PROCEDURE IN DETAIL: Adductor canal block was obtained in the pre-op holding area. Once inside the operative room, spinal anesthesia was attempted unsuccessfully.  General anesthesia was induced, and a foley catheter was inserted. The patient was then positioned, a nonsterile tourniquet was placed, and the lower extremity was prepped and draped in the normal sterile surgical fashion. A time-out was called verifying side and site of surgery. The patient received IV antibiotics within 60 minutes of beginning the procedure. The tourniquet was not utilized.  An anterior approach to the knee was performed utilizing a medial parapatellar arthrotomy.  Patient had severe synovitis, and I performed a radical synovectomy of the suprapatellar pouch, as well as medial and lateral gutters.  A medial release was performed and the  patellar fat pad was excised. Stryker navigation was used to cut the distal femur perpendicular to the mechanical axis. A freehand patellar resection was performed, and the patella was sized an prepared with 3 lug holes.  Nagivation was used to make a neutral proximal tibia resection, taking 3 mm of bone from the less affected medial side with 3 degrees of slope. The menisci were excised. A spacer block was placed, and the alignment and balance in extension were confirmed.   The distal femur was sized using the 3-degree external rotation guide referencing the posterior femoral cortex. The appropriate 4-in-1 cutting block was pinned into place. Rotation was checked using Whiteside's line, the epicondylar axis, and then confirmed with a spacer block in flexion. The remaining femoral cuts were performed, taking care to protect the MCL.  The tibia was sized and the trial tray was pinned into place. The remaining trail components were inserted. The knee was stable to varus and valgus stress through a full range of motion. The patella tracked centrally, and the PCL was well balanced. The trial components were removed, and the proximal tibial surface was prepared. Final components were impacted into place. The knee was tested for a final time and found to be well balanced.  The wound was copiously irrigated with normal saline with pulse lavage. Marcaine solution was injected into the periarticular soft tissue. The wound was closed in layers using #1 Vicryl and Stratafix for the fascia, 2-0 Vicryl for the subcutaneous fat, 2-0 Monocryl for the deep dermal layer, 3-0 running Monocryl subcuticular Stitch, and Dermabond for the skin. Once the glue was fully dried, an Aquacell Ag and compressive dressing were applied. Tthe patient was transported to the recovery room in stable condition. Sponge, needle, and instrument counts were correct at the end of the case x2. The  patient tolerated the procedure well and  there were no known complications.  Please note that a surgical assistant was a medical necessity for this procedure in order to perform it in a safe and expeditious manner. Surgical assistant was necessary to retract the ligaments and vital neurovascular structures to prevent injury to them and also necessary for proper positioning of the limb to allow for anatomic placement of the prosthesis.

## 2017-12-30 NOTE — Interval H&P Note (Signed)
History and Physical Interval Note:  12/30/2017 1:44 PM  Jon Robinson.  has presented today for surgery, with the diagnosis of Degenerative joint disease right knee  The various methods of treatment have been discussed with the patient and family. After consideration of risks, benefits and other options for treatment, the patient has consented to  Procedure(s) with comments: RIGHT TOTAL KNEE ARTHROPLASTY (Right) - Needs RNFA as a surgical intervention .  The patient's history has been reviewed, patient examined, no change in status, stable for surgery.  I have reviewed the patient's chart and labs.  Questions were answered to the patient's satisfaction.     [name]

## 2017-12-30 NOTE — Anesthesia Procedure Notes (Signed)
Anesthesia Regional Block: Adductor canal block   Pre-Anesthetic Checklist: ,, timeout performed, Correct Patient, Correct Site, Correct Laterality, Correct Procedure, Correct Position, site marked, Risks and benefits discussed, pre-op evaluation,  At surgeon's request and post-op pain management  Laterality: Right  Prep: Maximum Sterile Barrier Precautions used, chloraprep       Needles:  Injection technique: Single-shot  Needle Type: Echogenic Stimulator Needle     Needle Length: 9cm  Needle Gauge: 21     Additional Needles:   Procedures:,,,, ultrasound used (permanent image in chart),,,,  Narrative:  Start time: 12/30/2017 1:15 PM End time: 12/30/2017 1:25 PM Injection made incrementally with aspirations every 5 mL.  Performed by: Personally  Anesthesiologist: Roderic Palau, MD  Additional Notes: 2% Lidocaine skin wheel.

## 2017-12-30 NOTE — Anesthesia Preprocedure Evaluation (Addendum)
Anesthesia Evaluation  Patient identified by MRN, date of birth, ID band Patient awake    Reviewed: Allergy & Precautions, H&P , NPO status , Patient's Chart, lab work & pertinent test results  Airway Mallampati: I  TM Distance: >3 FB Neck ROM: Full    Dental no notable dental hx. (+) Teeth Intact, Dental Advisory Given   Pulmonary neg pulmonary ROS, former smoker,    Pulmonary exam normal breath sounds clear to auscultation       Cardiovascular hypertension, Pt. on medications  Rhythm:Regular Rate:Normal     Neuro/Psych  Headaches, negative psych ROS   GI/Hepatic Neg liver ROS, GERD  Medicated and Controlled,  Endo/Other  diabetes  Renal/GU negative Renal ROS  negative genitourinary   Musculoskeletal   Abdominal   Peds  Hematology negative hematology ROS (+)   Anesthesia Other Findings   Reproductive/Obstetrics negative OB ROS                            Anesthesia Physical Anesthesia Plan  ASA: II  Anesthesia Plan: Spinal   Post-op Pain Management:  Regional for Post-op pain   Induction: Intravenous  PONV Risk Score and Plan: 2 and Ondansetron, Dexamethasone and Midazolam  Airway Management Planned: Simple Face Mask  Additional Equipment:   Intra-op Plan:   Post-operative Plan:   Informed Consent: I have reviewed the patients History and Physical, chart, labs and discussed the procedure including the risks, benefits and alternatives for the proposed anesthesia with the patient or authorized representative who has indicated his/her understanding and acceptance.   Dental advisory given  Plan Discussed with: CRNA  Anesthesia Plan Comments:         Anesthesia Quick Evaluation

## 2017-12-30 NOTE — Addendum Note (Signed)
Addendum  created 12/30/17 1846 by Roderic Palau, MD   SmartForm saved

## 2017-12-30 NOTE — Transfer of Care (Signed)
Immediate Anesthesia Transfer of Care Note  Patient: Jon Robinson.  Procedure(s) Performed: RIGHT TOTAL KNEE ARTHROPLASTY (Right Knee)  Patient Location: PACU  Anesthesia Type:GA combined with regional for post-op pain  Level of Consciousness: drowsy and responds to stimulation  Airway & Oxygen Therapy: Patient Spontanous Breathing and Patient connected to face mask oxygen  Post-op Assessment: Report given to RN and Post -op Vital signs reviewed and stable  Post vital signs: Reviewed and stable  Last Vitals:  Vitals:   12/30/17 1322 12/30/17 1323  BP: (!) 169/99   Pulse: 64 68  Resp: (!) 9   Temp:    SpO2: 100% 97%    Last Pain:  Vitals:   12/30/17 1251  TempSrc: Oral      Patients Stated Pain Goal: 3 (68/34/19 6222)  Complications: No apparent anesthesia complications

## 2017-12-30 NOTE — Anesthesia Procedure Notes (Signed)
Procedure Name: LMA Insertion Date/Time: 12/30/2017 2:16 PM Performed by: West Pugh, CRNA Pre-anesthesia Checklist: Patient identified, Emergency Drugs available, Suction available, Patient being monitored and Timeout performed Patient Re-evaluated:Patient Re-evaluated prior to induction Oxygen Delivery Method: Circle system utilized Preoxygenation: Pre-oxygenation with 100% oxygen Induction Type: IV induction Ventilation: Mask ventilation without difficulty LMA: LMA with gastric port inserted LMA Size: 5.0 Number of attempts: 1 Placement Confirmation: positive ETCO2 Tube secured with: Tape Dental Injury: Teeth and Oropharynx as per pre-operative assessment

## 2017-12-30 NOTE — Addendum Note (Signed)
Addendum  created 12/30/17 1929 by West Pugh, CRNA   Intraprocedure Meds edited

## 2017-12-31 ENCOUNTER — Encounter (HOSPITAL_COMMUNITY): Payer: Self-pay | Admitting: Orthopedic Surgery

## 2017-12-31 LAB — GLUCOSE, CAPILLARY
GLUCOSE-CAPILLARY: 163 mg/dL — AB (ref 65–99)
GLUCOSE-CAPILLARY: 173 mg/dL — AB (ref 65–99)
GLUCOSE-CAPILLARY: 144 mg/dL — AB (ref 65–99)
Glucose-Capillary: 128 mg/dL — ABNORMAL HIGH (ref 65–99)

## 2017-12-31 LAB — BASIC METABOLIC PANEL
ANION GAP: 8 (ref 5–15)
BUN: 14 mg/dL (ref 6–20)
CALCIUM: 8.2 mg/dL — AB (ref 8.9–10.3)
CO2: 22 mmol/L (ref 22–32)
CREATININE: 1.16 mg/dL (ref 0.61–1.24)
Chloride: 106 mmol/L (ref 101–111)
Glucose, Bld: 138 mg/dL — ABNORMAL HIGH (ref 65–99)
Potassium: 5.9 mmol/L — ABNORMAL HIGH (ref 3.5–5.1)
SODIUM: 136 mmol/L (ref 135–145)

## 2017-12-31 LAB — CBC
HCT: 36.8 % — ABNORMAL LOW (ref 39.0–52.0)
Hemoglobin: 11.7 g/dL — ABNORMAL LOW (ref 13.0–17.0)
MCH: 26.1 pg (ref 26.0–34.0)
MCHC: 31.8 g/dL (ref 30.0–36.0)
MCV: 82 fL (ref 78.0–100.0)
PLATELETS: 182 10*3/uL (ref 150–400)
RBC: 4.49 MIL/uL (ref 4.22–5.81)
RDW: 16 % — AB (ref 11.5–15.5)
WBC: 10.2 10*3/uL (ref 4.0–10.5)

## 2017-12-31 MED ORDER — ONDANSETRON HCL 4 MG PO TABS: 4.0000 mg | ORAL_TABLET | Freq: Four times a day (QID) | ORAL | 0 refills | Status: DC | PRN

## 2017-12-31 MED ORDER — ASPIRIN 81 MG PO CHEW: 81.0000 mg | CHEWABLE_TABLET | Freq: Two times a day (BID) | ORAL | 1 refills | Status: DC

## 2017-12-31 MED ORDER — DOCUSATE SODIUM 100 MG PO CAPS: 100.0000 mg | ORAL_CAPSULE | Freq: Two times a day (BID) | ORAL | 1 refills | Status: DC

## 2017-12-31 MED ORDER — SENNA 8.6 MG PO TABS: 2.0000 | ORAL_TABLET | Freq: Every day | ORAL | 3 refills | Status: DC

## 2017-12-31 MED ORDER — OXYCODONE HCL ER 10 MG PO T12A: 10.0000 mg | EXTENDED_RELEASE_TABLET | ORAL | 0 refills | Status: DC

## 2017-12-31 MED ORDER — ACETAMINOPHEN 500 MG PO TABS: 1000.0000 mg | ORAL_TABLET | Freq: Three times a day (TID) | ORAL | 2 refills | Status: AC | PRN

## 2017-12-31 MED ORDER — OXYCODONE HCL 5 MG PO TABS: 5.0000 mg | ORAL_TABLET | ORAL | 0 refills | Status: DC | PRN

## 2017-12-31 NOTE — Progress Notes (Addendum)
Physical Therapy Treatment Patient Details Name: Jon Robinson. MRN: [id number] DOB: May 24, 1951 Today's Date: 12/31/2017    History of Present Illness 67 yo male s/p R TKA 12/30/17    PT Comments    Progressing with mobility.    Follow Up Recommendations  Follow surgeon's recommendation for DC plan and follow-up therapies     Equipment Recommendations  None recommended by PT    Recommendations for Other Services       Precautions / Restrictions Precautions Precautions: Fall;Knee Restrictions Weight Bearing Restrictions: No RLE Weight Bearing: Weight bearing as tolerated    Mobility  Bed Mobility Overal bed mobility: Needs Assistance Bed Mobility: Sit to Supine     Supine to sit: Min assist Sit to supine: Min assist   General bed mobility comments: Assist for R LE.   Transfers Overall transfer level: Needs assistance Equipment used: Rolling walker (2 wheeled) Transfers: Sit to/from Stand Sit to Stand: Min guard         General transfer comment: VCs safety, technique, hand/LE placement. close guard for safety  Ambulation/Gait Ambulation/Gait assistance: Min guard Ambulation Distance (Feet): 115 Feet Assistive device: Rolling walker (2 wheeled) Gait Pattern/deviations: Step-to pattern;Trunk flexed     General Gait Details: Cues for safety, posture, distance from RW, step length. Close guard for safety   Stairs            Wheelchair Mobility    Modified Rankin (Stroke Patients Only)       Balance                                            Cognition Arousal/Alertness: Awake/alert Behavior During Therapy: WFL for tasks assessed/performed Overall Cognitive Status: Within Functional Limits for tasks assessed                                        Exercises Total Joint Exercises Quad Sets: AROM;Both;Supine;10 reps Heel Slides: AAROM;Right;10 reps;Supine Straight Leg Raises: AAROM;Right;10  reps;Supine Goniometric ROM: ~10-55 degrees    General Comments        Pertinent Vitals/Pain Pain Assessment: 0-10 Pain Score: 5  Pain Location: R knee Pain Descriptors / Indicators: Sore;Aching Pain Intervention(s): Monitored during session;Repositioned;Ice applied    Home Living Family/patient expects to be discharged to:: Private residence Living Arrangements: Alone   Type of Home: Apartment Home Access: Level entry   Home Layout: One level Home Equipment: Environmental consultant - 2 wheels      Prior Function Level of Independence: Independent          PT Goals (current goals can now be found in the care plan section) Acute Rehab PT Goals Patient Stated Goal: regain PLOF PT Goal Formulation: With patient Time For Goal Achievement: 01/14/18 Potential to Achieve Goals: Good Progress towards PT goals: Progressing toward goals    Frequency    7X/week      PT Plan Current plan remains appropriate    Co-evaluation              AM-PAC PT "6 Clicks" Daily Activity  Outcome Measure  Difficulty turning over in bed (including adjusting bedclothes, sheets and blankets)?: A Little Difficulty moving from lying on back to sitting on the side of the bed? : Unable Difficulty sitting down on and standing up from  a chair with arms (e.g., wheelchair, bedside commode, etc,.)?: A Little Help needed moving to and from a bed to chair (including a wheelchair)?: A Little Help needed walking in hospital room?: A Little Help needed climbing 3-5 steps with a railing? : A Little 6 Click Score: 16    End of Session Equipment Utilized During Treatment: Gait belt Activity Tolerance: Patient tolerated treatment well Patient left: in bed;with call bell/phone within reach   PT Visit Diagnosis: Muscle weakness (generalized) (M62.81);Difficulty in walking, not elsewhere classified (R26.2)     Time: 2197-5883 PT Time Calculation (min) (ACUTE ONLY): 28 min  Charges:  $Gait Training: 8-22  mins $Therapeutic Exercise: 8-22 mins                    G Codes:         Weston Anna, MPT Pager: [phone]

## 2017-12-31 NOTE — Discharge Summary (Signed)
Physician Discharge Summary  Patient ID: Jon Robinson. MRN: [id number] DOB/AGE: 66-01-1951 72 y.o.  Admit date: 12/30/2017 Discharge date: 01/01/2018  Admission Diagnoses:  Osteoarthritis of right knee  Discharge Diagnoses:  Principal Problem:   Osteoarthritis of right knee   Past Medical History:  Diagnosis Date  . Arthritis   . Diabetes mellitus without complication ([place])    Diet controlled  . GERD (gastroesophageal reflux disease)   . Headache   . History of kidney stones   . Hypertension   . Neuromuscular disorder ([place])    Finger tips numbness/tingling    Surgeries: Procedure(s): RIGHT TOTAL KNEE ARTHROPLASTY on 12/30/2017   Consultants (if any):   Discharged Condition: Improved  Hospital Course: [name] Favor. is an 67 y.o. male who was admitted 12/30/2017 with a diagnosis of Osteoarthritis of right knee and went to the operating room on 12/30/2017 and underwent the above named procedures.    He was given perioperative antibiotics:  Anti-infectives (From admission, onward)   Start     Dose/Rate Route Frequency Ordered Stop   12/30/17 2100  clindamycin (CLEOCIN) IVPB 600 mg     600 mg 100 mL/hr over 30 Minutes Intravenous Every 6 hours 12/30/17 2013 12/31/17 0352   12/30/17 1245  clindamycin (CLEOCIN) IVPB 900 mg     900 mg 100 mL/hr over 30 Minutes Intravenous On call to O.R. 12/30/17 1231 12/30/17 1448    .  He was given sequential compression devices, early ambulation, and ASA for DVT prophylaxis.  He benefited maximally from the hospital stay and there were no complications.    Recent vital signs:  Vitals:   12/31/17 2106 01/01/18 0447  BP: (!) 165/88 140/88  Pulse: 85 76  Resp: 18 18  Temp: 99.2 F (37.3 C) 98.7 F (37.1 C)  SpO2: 98% 100%    Recent laboratory studies:  Lab Results  Component Value Date   HGB 10.5 (L) 01/01/2018   HGB 11.7 (L) 12/31/2017   HGB 13.0 12/23/2017   Lab Results  Component Value Date   WBC 11.0 (H) 01/01/2018   PLT 244 01/01/2018   No results found for: INR Lab Results  Component Value Date   NA 136 12/31/2017   K 5.9 (H) 12/31/2017   CL 106 12/31/2017   CO2 22 12/31/2017   BUN 14 12/31/2017   CREATININE 1.16 12/31/2017   GLUCOSE 138 (H) 12/31/2017    Discharge Medications:   Allergies as of 01/01/2018      Reactions   Novocain [procaine] Swelling   In the face   Penicillins Other (See Comments)      Medication List    STOP taking these medications   oxyCODONE-acetaminophen 10-325 MG tablet Commonly known as:  PERCOCET     TAKE these medications   acetaminophen 500 MG tablet Commonly known as:  TYLENOL Take 2 tablets (1,000 mg total) by mouth every 8 (eight) hours as needed.   ALLERGY RELIEF 10 MG tablet Generic drug:  loratadine Take 10 mg by mouth daily. ALLERGY RELIEF   ASPERCREME 10 % Lotn Generic drug:  Trolamine Salicylate Apply 1 application topically 2 (two) times daily as needed (pain in hands).   aspirin 81 MG chewable tablet Chew 1 tablet (81 mg total) by mouth 2 (two) times daily.   docusate sodium 100 MG capsule Commonly known as:  COLACE Take 1 capsule (100 mg total) by mouth 2 (two) times daily.   hydrochlorothiazide 25 MG tablet Commonly known as:  HYDRODIURIL Take  25 mg by mouth daily.   methocarbamol 500 MG tablet Commonly known as:  ROBAXIN Take 500 mg by mouth 4 (four) times daily as needed for muscle spasms.   ondansetron 4 MG tablet Commonly known as:  ZOFRAN Take 1 tablet (4 mg total) by mouth every 6 (six) hours as needed for nausea.   oxyCODONE 5 MG immediate release tablet Commonly known as:  Oxy IR/ROXICODONE Take 1-2 tablets (5-10 mg total) by mouth every 4 (four) hours as needed.   oxyCODONE 10 mg 12 hr tablet Commonly known as:  OXYCONTIN Take 1 tablet (10 mg total) by mouth PRO. 1 tab PO every 12 hours for 3 days, then 1 tab PO daily for 4 days   pyridOXINE 100 MG tablet Commonly known as:  VITAMIN B-6 Take 200 mg by mouth  daily.   senna 8.6 MG Tabs tablet Commonly known as:  SENOKOT Take 2 tablets (17.2 mg total) by mouth at bedtime.   vitamin C 1000 MG tablet Take 1,000 mg by mouth daily.       Diagnostic Studies: Dg Knee Right Port  Result Date: 12/30/2017 CLINICAL DATA:  Postop knee replacement EXAM: PORTABLE RIGHT KNEE - 1-2 VIEW COMPARISON:  None. FINDINGS: Status post right knee replacement with normal alignment. Moderate gas within the soft tissues and joint space. Vascular calcifications. No fracture. IMPRESSION: Status post right knee replacement with expected postsurgical changes. Electronically Signed   By: Donavan Foil M.D.   On: 12/30/2017 20:03    Disposition: 01-Home or Self Care  Discharge Instructions    Call MD / Call 911   Complete by:  As directed    If you experience chest pain or shortness of breath, CALL 911 and be transported to the hospital emergency room.  If you develope a fever above 101 F, pus (white drainage) or increased drainage or redness at the wound, or calf pain, call your surgeon's office.   Constipation Prevention   Complete by:  As directed    Drink plenty of fluids.  Prune juice may be helpful.  You may use a stool softener, such as Colace (over the counter) 100 mg twice a day.  Use MiraLax (over the counter) for constipation as needed.   Diet - low sodium heart healthy   Complete by:  As directed    Increase activity slowly as tolerated   Complete by:  As directed       Follow-up Information    Gabrian Hoque, Aaron Edelman, MD. Schedule an appointment as soon as possible for a visit in 2 weeks.   Specialty:  Orthopedic Surgery Why:  For wound re-check Contact information: [address] [place] [address] [phone]            Signed: Hilton Cork [place] 01/01/2018, 7:55 PM

## 2017-12-31 NOTE — Progress Notes (Signed)
    Subjective:  Patient reports pain as mild to moderate.  Denies N/V/CP/SOB.   Objective:   VITALS:   Vitals:   12/30/17 2218 12/30/17 2317 12/31/17 0203 12/31/17 0556  BP: (!) 158/77 (!) 154/81 (!) 160/87 133/82  Pulse: 84 78 82 86  Resp: 16 17 16 17   Temp: 99.4 F (37.4 C) 99 F (37.2 C) 99 F (37.2 C) 99.1 F (37.3 C)  TempSrc: Oral Oral Oral Oral  SpO2: 99% 100% 97% 99%  Weight:      Height:        NAD ABD soft Sensation intact distally Intact pulses distally Dorsiflexion/Plantar flexion intact Incision: dressing C/D/I Compartment soft   Lab Results  Component Value Date   WBC 10.2 12/31/2017   HGB 11.7 (L) 12/31/2017   HCT 36.8 (L) 12/31/2017   MCV 82.0 12/31/2017   PLT 182 12/31/2017   BMET    Component Value Date/Time   NA 136 12/31/2017 0550   K 5.9 (H) 12/31/2017 0550   CL 106 12/31/2017 0550   CO2 22 12/31/2017 0550   GLUCOSE 138 (H) 12/31/2017 0550   BUN 14 12/31/2017 0550   CREATININE 1.16 12/31/2017 0550   CALCIUM 8.2 (L) 12/31/2017 0550   GFRNONAA >60 12/31/2017 0550   GFRAA >60 12/31/2017 0550     Assessment/Plan: 1 Day Post-Op   Active Problems:   Osteoarthritis of right hip   WBAT with walker DVT ppx: ASA, SCDs, TEDS PO pain control PT/OT Dispo: D/C home tomorrow with outpatient PT   Hilton Cork Kooper [place] 12/31/2017, 7:54 AM   Rod Can, MD Cell [phone]

## 2017-12-31 NOTE — Progress Notes (Signed)
   12/31/17 1400  Clinical Encounter Type  Visited With Patient  Visit Type Initial;Psychological support;Spiritual support  Referral From Nurse  Consult/Referral To Chaplain  Spiritual Encounters  Spiritual Needs Emotional;Other (Comment) (Spiritual Care Conversation/Support)  Stress Factors  Patient Stress Factors None identified   I visited with the patient per Spiritual Care consult. The patient was on his phone at the time of my visit, and requested that I come back.   Please, contact Spiritual Care for further assistance.   Chaplain Shanon Ace M.Div., [facility]

## 2017-12-31 NOTE — Progress Notes (Signed)
Spoke with patient at bedside. Confirmed plan for OP PT, already arranged. Has RW and 3n1. [phone] 

## 2017-12-31 NOTE — Evaluation (Signed)
Physical Therapy Evaluation Patient Details Name: Jon Robinson. MRN: [id number] DOB: 05-Mar-1951 Today's Date: 12/31/2017   History of Present Illness  67 yo male s/p R TKA 12/30/17  Clinical Impression  On eval, pt required Min assist for mobility. He walked ~100 feet with a RW. Pain rated 6/10 with activity. Will follow and progress activity as tolerated.     Follow Up Recommendations Follow surgeon's recommendation for DC plan and follow-up therapies    Equipment Recommendations  None recommended by PT    Recommendations for Other Services       Precautions / Restrictions Precautions Precautions: Fall;Knee Restrictions Weight Bearing Restrictions: No RLE Weight Bearing: Weight bearing as tolerated      Mobility  Bed Mobility Overal bed mobility: Needs Assistance Bed Mobility: Supine to Sit     Supine to sit: Min assist     General bed mobility comments: Assist for R LE.   Transfers Overall transfer level: Needs assistance Equipment used: Rolling walker (2 wheeled) Transfers: Sit to/from Stand Sit to Stand: Min assist         General transfer comment: VCs safety, technique, hand/LE placement   Ambulation/Gait Ambulation/Gait assistance: Min assist Ambulation Distance (Feet): 100 Feet Assistive device: Rolling walker (2 wheeled) Gait Pattern/deviations: Step-to pattern;Trunk flexed     General Gait Details: Cues for safety, posture, distance from RW, step length. Assist to steady intermittently.   Stairs            Wheelchair Mobility    Modified Rankin (Stroke Patients Only)       Balance                                             Pertinent Vitals/Pain Pain Assessment: 0-10 Pain Score: 6  Pain Location: R knee Pain Descriptors / Indicators: Sore;Aching Pain Intervention(s): Monitored during session;Repositioned;Ice applied    Home Living Family/patient expects to be discharged to:: Private residence Living  Arrangements: Alone   Type of Home: Apartment Home Access: Level entry     Home Layout: One level Home Equipment: Environmental consultant - 2 wheels      Prior Function Level of Independence: Independent               Hand Dominance        Extremity/Trunk Assessment   Upper Extremity Assessment Upper Extremity Assessment: Overall WFL for tasks assessed    Lower Extremity Assessment Lower Extremity Assessment: Generalized weakness(s/p R TKA)    Cervical / Trunk Assessment Cervical / Trunk Assessment: Normal  Communication   Communication: No difficulties  Cognition Arousal/Alertness: Awake/alert Behavior During Therapy: WFL for tasks assessed/performed Overall Cognitive Status: Within Functional Limits for tasks assessed                                        General Comments      Exercises Total Joint Exercises Ankle Circles/Pumps: AROM;Both;10 reps;Supine Quad Sets: AROM;Both;Supine;10 reps Hip ABduction/ADduction: AAROM;Right;10 reps;Supine Straight Leg Raises: AAROM;Right;10 reps;Supine Goniometric ROM: ~10-50 degrees   Assessment/Plan    PT Assessment Patient needs continued PT services  PT Problem List Decreased strength;Decreased balance;Decreased range of motion;Decreased mobility;Decreased activity tolerance;Pain;Decreased knowledge of use of DME       PT Treatment Interventions DME instruction;Gait training;Functional mobility training;Therapeutic activities;Balance training;Patient/family education;Therapeutic  exercise;Stair training    PT Goals (Current goals can be found in the Care Plan section)  Acute Rehab PT Goals Patient Stated Goal: regain PLOF PT Goal Formulation: With patient Time For Goal Achievement: 01/14/18 Potential to Achieve Goals: Good    Frequency 7X/week   Barriers to discharge        Co-evaluation               AM-PAC PT "6 Clicks" Daily Activity  Outcome Measure Difficulty turning over in bed  (including adjusting bedclothes, sheets and blankets)?: A Little Difficulty moving from lying on back to sitting on the side of the bed? : Unable Difficulty sitting down on and standing up from a chair with arms (e.g., wheelchair, bedside commode, etc,.)?: Unable Help needed moving to and from a bed to chair (including a wheelchair)?: A Little Help needed walking in hospital room?: A Little Help needed climbing 3-5 steps with a railing? : A Little 6 Click Score: 14    End of Session Equipment Utilized During Treatment: Gait belt Activity Tolerance: Patient tolerated treatment well Patient left: in chair;with call bell/phone within reach;with chair alarm set   PT Visit Diagnosis: Muscle weakness (generalized) (M62.81);Difficulty in walking, not elsewhere classified (R26.2)    Time: 4628-6381 PT Time Calculation (min) (ACUTE ONLY): 26 min   Charges:   PT Evaluation $PT Eval Low Complexity: 1 Low PT Treatments $Gait Training: 8-22 mins   PT G Codes:          Weston Anna, MPT Pager: [phone]

## 2018-01-01 LAB — GLUCOSE, CAPILLARY
GLUCOSE-CAPILLARY: 135 mg/dL — AB (ref 65–99)
Glucose-Capillary: 109 mg/dL — ABNORMAL HIGH (ref 65–99)

## 2018-01-01 LAB — CBC
HCT: 33 % — ABNORMAL LOW (ref 39.0–52.0)
Hemoglobin: 10.5 g/dL — ABNORMAL LOW (ref 13.0–17.0)
MCH: 25.9 pg — ABNORMAL LOW (ref 26.0–34.0)
MCHC: 31.8 g/dL (ref 30.0–36.0)
MCV: 81.3 fL (ref 78.0–100.0)
PLATELETS: 244 10*3/uL (ref 150–400)
RBC: 4.06 MIL/uL — ABNORMAL LOW (ref 4.22–5.81)
RDW: 16 % — AB (ref 11.5–15.5)
WBC: 11 10*3/uL — ABNORMAL HIGH (ref 4.0–10.5)

## 2018-01-01 NOTE — Plan of Care (Signed)
Progressing well.  Looking forward to dc home.

## 2018-01-01 NOTE — Progress Notes (Addendum)
Physical Therapy Treatment Patient Details Name: Jon Robinson. MRN: [id number] DOB: 1951-10-24 Today's Date: 01/01/2018    History of Present Illness 67 yo male s/p R TKA 12/30/17    PT Comments    Progressing with mobility. Reviewed exercises, gait training. Issued HEP for pt to perform 2x/day until he begins OP PT. All education completed. Okay to d/c from PT standpoint-made RN aware.  Follow Up Recommendations  Follow surgeon's recommendation for DC plan and follow-up therapies     Equipment Recommendations  None recommended by PT    Recommendations for Other Services       Precautions / Restrictions Precautions Precautions: Fall;Knee Restrictions Weight Bearing Restrictions: No RLE Weight Bearing: Weight bearing as tolerated    Mobility  Bed Mobility Overal bed mobility: Needs Assistance Bed Mobility: Supine to Sit     Supine to sit: Supervision     General bed mobility comments: for safety  Transfers Overall transfer level: Needs assistance Equipment used: Rolling walker (2 wheeled) Transfers: Sit to/from Stand Sit to Stand: Supervision         General transfer comment: for safety. VCs hand/LE placement  Ambulation/Gait Ambulation/Gait assistance: Min guard Ambulation Distance (Feet): 150 Feet Assistive device: Rolling walker (2 wheeled) Gait Pattern/deviations: Step-to pattern;Step-through pattern;Decreased stride length     General Gait Details: close guard for safety. VCs safety, posture, distance from RW, step length.    Stairs            Wheelchair Mobility    Modified Rankin (Stroke Patients Only)       Balance                                            Cognition Arousal/Alertness: Awake/alert Behavior During Therapy: WFL for tasks assessed/performed Overall Cognitive Status: Within Functional Limits for tasks assessed                                        Exercises Total Joint  Exercises Ankle Circles/Pumps: AROM;Both;10 reps;Supine Quad Sets: AROM;Both;Supine;10 reps Hip ABduction/ADduction: AAROM;Right;10 reps;Supine Straight Leg Raises: AAROM;Right;10 reps;Supine Knee Flexion: AAROM;Right;10 reps;Seated Goniometric ROM: ~10-65 degree    General Comments        Pertinent Vitals/Pain Pain Assessment: 0-10 Pain Score: 5  Pain Location: R knee Pain Descriptors / Indicators: Sore;Aching Pain Intervention(s): Monitored during session;Repositioned;Ice applied    Home Living                      Prior Function            PT Goals (current goals can now be found in the care plan section) Progress towards PT goals: Progressing toward goals    Frequency    7X/week      PT Plan Current plan remains appropriate    Co-evaluation              AM-PAC PT "6 Clicks" Daily Activity  Outcome Measure  Difficulty turning over in bed (including adjusting bedclothes, sheets and blankets)?: None Difficulty moving from lying on back to sitting on the side of the bed? : None Difficulty sitting down on and standing up from a chair with arms (e.g., wheelchair, bedside commode, etc,.)?: A Little Help needed moving to and from a bed to chair (  including a wheelchair)?: A Little Help needed walking in hospital room?: A Little Help needed climbing 3-5 steps with a railing? : A Little 6 Click Score: 20    End of Session Equipment Utilized During Treatment: Gait belt Activity Tolerance: Patient tolerated treatment well Patient left: in chair;with call bell/phone within reach   PT Visit Diagnosis: Muscle weakness (generalized) (M62.81);Difficulty in walking, not elsewhere classified (R26.2)     Time: 1497-0263 PT Time Calculation (min) (ACUTE ONLY): 32 min  Charges:  $Gait Training: 8-22 mins $Therapeutic Exercise: 8-22 mins                    G Codes:          Weston Anna, MPT Pager: [phone]

## 2018-01-01 NOTE — Progress Notes (Signed)
Orthopedic Discharge Summary        Physician Discharge Summary  Patient ID: Jon Robinson. MRN: [id number] DOB/AGE: 1951-05-25 67 y.o.  Admit date: 12/30/2017 Discharge date: 01/01/2018   Procedures:  Procedure(s) (LRB): RIGHT TOTAL KNEE ARTHROPLASTY (Right)  Attending Physician:  Dr. Rod Can   Admission Diagnoses:   Right knee end stage osteoarthritis  Discharge Diagnoses:  Right knee end stage osteoarthritis   Past Medical History:  Diagnosis Date  . Arthritis   . Diabetes mellitus without complication ([place])    Diet controlled  . GERD (gastroesophageal reflux disease)   . Headache   . History of kidney stones   . Hypertension   . Neuromuscular disorder ([place])    Finger tips numbness/tingling    PCP: Donita Brooks, MD   Discharged Condition: stable  Hospital Course:  Patient underwent the above stated procedure on 12/30/2017. Patient tolerated the procedure well and brought to the recovery room in good condition and subsequently to the floor. Patient had an uncomplicated hospital course and was stable for discharge.   Disposition: Final discharge disposition not confirmed with follow up in 2 weeks   Follow-up Information    Swinteck, Aaron Edelman, MD. Schedule an appointment as soon as possible for a visit in 2 weeks.   Specialty:  Orthopedic Surgery Why:  For wound re-check Contact information: [address] [place] [address] [phone]           Discharge Instructions    Call MD / Call 911   Complete by:  As directed    If you experience chest pain or shortness of breath, CALL 911 and be transported to the hospital emergency room.  If you develope a fever above 101 F, pus (white drainage) or increased drainage or redness at the wound, or calf pain, call your surgeon's office.   Constipation Prevention   Complete by:  As directed    Drink plenty of fluids.  Prune juice may be helpful.  You may use a stool softener, such as  Colace (over the counter) 100 mg twice a day.  Use MiraLax (over the counter) for constipation as needed.   Diet - low sodium heart healthy   Complete by:  As directed    Increase activity slowly as tolerated   Complete by:  As directed       Allergies as of 01/01/2018      Reactions   Novocain [procaine] Swelling   In the face   Penicillins Other (See Comments)      Medication List    STOP taking these medications   oxyCODONE-acetaminophen 10-325 MG tablet Commonly known as:  PERCOCET     TAKE these medications   acetaminophen 500 MG tablet Commonly known as:  TYLENOL Take 2 tablets (1,000 mg total) by mouth every 8 (eight) hours as needed.   ALLERGY RELIEF 10 MG tablet Generic drug:  loratadine Take 10 mg by mouth daily. ALLERGY RELIEF   ASPERCREME 10 % Lotn Generic drug:  Trolamine Salicylate Apply 1 application topically 2 (two) times daily as needed (pain in hands).   aspirin 81 MG chewable tablet Chew 1 tablet (81 mg total) by mouth 2 (two) times daily.   docusate sodium 100 MG capsule Commonly known as:  COLACE Take 1 capsule (100 mg total) by mouth 2 (two) times daily.   hydrochlorothiazide 25 MG tablet Commonly known as:  HYDRODIURIL Take 25 mg by mouth daily.   methocarbamol 500 MG tablet Commonly known as:  ROBAXIN Take 500 mg by mouth 4 (four) times daily as needed for muscle spasms.   ondansetron 4 MG tablet Commonly known as:  ZOFRAN Take 1 tablet (4 mg total) by mouth every 6 (six) hours as needed for nausea.   oxyCODONE 5 MG immediate release tablet Commonly known as:  Oxy IR/ROXICODONE Take 1-2 tablets (5-10 mg total) by mouth every 4 (four) hours as needed.   oxyCODONE 10 mg 12 hr tablet Commonly known as:  OXYCONTIN Take 1 tablet (10 mg total) by mouth PRO. 1 tab PO every 12 hours for 3 days, then 1 tab PO daily for 4 days   pyridOXINE 100 MG tablet Commonly known as:  VITAMIN B-6 Take 200 mg by mouth daily.   senna 8.6 MG Tabs  tablet Commonly known as:  SENOKOT Take 2 tablets (17.2 mg total) by mouth at bedtime.   vitamin C 1000 MG tablet Take 1,000 mg by mouth daily.         Signed: Ventura Bruns 01/01/2018, 7:52 AM  [facility] is now [employer] [address] [place], [place], [place] [address] Phone: [place]

## 2018-01-01 NOTE — Progress Notes (Signed)
   Subjective: 2 Days Post-Op Procedure(s) (LRB): RIGHT TOTAL KNEE ARTHROPLASTY (Right)  Right knee doing well Mild pain/soreness Therapy going well Denies any new symptoms or issues Patient reports pain as mild.  Objective:   VITALS:   Vitals:   12/31/17 2106 01/01/18 0447  BP: (!) 165/88 140/88  Pulse: 85 76  Resp: 18 18  Temp: 99.2 F (37.3 C) 98.7 F (37.1 C)  SpO2: 98% 100%    Right knee incision healing well nv intact distally No rashes or edema distally  LABS Recent Labs    12/31/17 0550 01/01/18 0519  HGB 11.7* 10.5*  HCT 36.8* 33.0*  WBC 10.2 11.0*  PLT 182 244    Recent Labs    12/31/17 0550  NA 136  K 5.9*  BUN 14  CREATININE 1.16  GLUCOSE 138*     Assessment/Plan: 2 Days Post-Op Procedure(s) (LRB): RIGHT TOTAL KNEE ARTHROPLASTY (Right) D/c home today after lunch Continue PT/OT F/u in 2 weeks in the office Pain management as needed    Kathrynn Speed, [facility] is now [facility] [address] [place], [place], [place] [address] Phone: [phone] [email or web address] Facebook  [employer]

## 2018-03-22 ENCOUNTER — Emergency Department (HOSPITAL_COMMUNITY)
Admission: EM | Admit: 2018-03-22 | Discharge: 2018-03-22 | Disposition: A | Discharge status: Home / Self Care | Payer: Medicare Other | Attending: Emergency Medicine | Admitting: Emergency Medicine

## 2018-03-22 ENCOUNTER — Other Ambulatory Visit: Payer: Self-pay

## 2018-03-22 ENCOUNTER — Encounter (HOSPITAL_COMMUNITY): Payer: Self-pay

## 2018-03-22 ENCOUNTER — Emergency Department (HOSPITAL_COMMUNITY): Payer: Medicare Other

## 2018-03-22 DIAGNOSIS — H5711 Ocular pain, right eye: Secondary | ICD-10-CM | POA: Diagnosis present

## 2018-03-22 DIAGNOSIS — Z7982 Long term (current) use of aspirin: Secondary | ICD-10-CM | POA: Diagnosis not present

## 2018-03-22 DIAGNOSIS — Z79899 Other long term (current) drug therapy: Secondary | ICD-10-CM | POA: Diagnosis not present

## 2018-03-22 DIAGNOSIS — E119 Type 2 diabetes mellitus without complications: Secondary | ICD-10-CM | POA: Insufficient documentation

## 2018-03-22 DIAGNOSIS — Y939 Activity, unspecified: Secondary | ICD-10-CM | POA: Diagnosis not present

## 2018-03-22 DIAGNOSIS — Y999 Unspecified external cause status: Secondary | ICD-10-CM | POA: Diagnosis not present

## 2018-03-22 DIAGNOSIS — S0511XA Contusion of eyeball and orbital tissues, right eye, initial encounter: Secondary | ICD-10-CM | POA: Diagnosis not present

## 2018-03-22 DIAGNOSIS — I10 Essential (primary) hypertension: Secondary | ICD-10-CM | POA: Diagnosis not present

## 2018-03-22 DIAGNOSIS — H209 Unspecified iridocyclitis: Secondary | ICD-10-CM | POA: Insufficient documentation

## 2018-03-22 DIAGNOSIS — Z87891 Personal history of nicotine dependence: Secondary | ICD-10-CM | POA: Insufficient documentation

## 2018-03-22 DIAGNOSIS — Y929 Unspecified place or not applicable: Secondary | ICD-10-CM | POA: Insufficient documentation

## 2018-03-22 MED ORDER — FLUORESCEIN SODIUM 1 MG OP STRP
1.0000 | ORAL_STRIP | Freq: Once | OPHTHALMIC | Status: AC
  Administered 2018-03-22: 1 via OPHTHALMIC
  Filled 2018-03-22: qty 1

## 2018-03-22 MED ORDER — KETOROLAC TROMETHAMINE 0.5 % OP SOLN
1.0000 [drp] | Freq: Once | OPHTHALMIC | Status: AC
  Administered 2018-03-22: 1 [drp] via OPHTHALMIC
  Filled 2018-03-22: qty 5

## 2018-03-22 MED ORDER — ERYTHROMYCIN 5 MG/GM OP OINT
TOPICAL_OINTMENT | Freq: Once | OPHTHALMIC | Status: AC
  Administered 2018-03-22: 1 via OPHTHALMIC
  Filled 2018-03-22: qty 3.5
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
## 2018-03-22 NOTE — ED Notes (Signed)
Visual acuity screening completed. Patient visual acuity bilaterally 20/50

## 2018-03-22 NOTE — Discharge Instructions (Addendum)
Use the eye drops, one drop in the right eye 4 times a day. Go to Dr. Colan Neptune office in the morning at 8:30 for further evaluation.
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
## 2019-09-15 ENCOUNTER — Other Ambulatory Visit: Payer: Self-pay | Admitting: Orthopedic Surgery

## 2019-09-15 DIAGNOSIS — M24231 Disorder of ligament, right wrist: Secondary | ICD-10-CM

## 2019-10-09 ENCOUNTER — Other Ambulatory Visit: Payer: Self-pay | Admitting: Orthopedic Surgery

## 2019-10-09 DIAGNOSIS — M24231 Disorder of ligament, right wrist: Secondary | ICD-10-CM

## 2019-10-10 ENCOUNTER — Ambulatory Visit
Admission: RE | Admit: 2019-10-10 | Discharge: 2019-10-10 | Disposition: A | Discharge status: Home / Self Care | Payer: Non-veteran care | Source: Ambulatory Visit | Attending: Orthopedic Surgery | Admitting: Orthopedic Surgery

## 2019-10-10 ENCOUNTER — Ambulatory Visit
Admission: RE | Admit: 2019-10-10 | Discharge: 2019-10-10 | Disposition: A | Discharge status: Home / Self Care | Payer: Medicare Other | Source: Ambulatory Visit | Attending: Orthopedic Surgery | Admitting: Orthopedic Surgery

## 2019-10-10 DIAGNOSIS — M24231 Disorder of ligament, right wrist: Secondary | ICD-10-CM

## 2020-11-23 ENCOUNTER — Emergency Department (HOSPITAL_COMMUNITY): Payer: No Typology Code available for payment source

## 2020-11-23 ENCOUNTER — Other Ambulatory Visit: Payer: Self-pay

## 2020-11-23 ENCOUNTER — Inpatient Hospital Stay (HOSPITAL_COMMUNITY)
Admission: EM | Admit: 2020-11-23 | Discharge: 2020-11-28 | DRG: 378 | Disposition: A | Discharge status: Home / Self Care | Payer: No Typology Code available for payment source | Attending: Internal Medicine | Admitting: Internal Medicine

## 2020-11-23 ENCOUNTER — Encounter (HOSPITAL_COMMUNITY): Payer: Self-pay | Admitting: Emergency Medicine

## 2020-11-23 DIAGNOSIS — R55 Syncope and collapse: Secondary | ICD-10-CM | POA: Diagnosis present

## 2020-11-23 DIAGNOSIS — E871 Hypo-osmolality and hyponatremia: Secondary | ICD-10-CM | POA: Diagnosis present

## 2020-11-23 DIAGNOSIS — Z8249 Family history of ischemic heart disease and other diseases of the circulatory system: Secondary | ICD-10-CM

## 2020-11-23 DIAGNOSIS — R3916 Straining to void: Secondary | ICD-10-CM | POA: Diagnosis present

## 2020-11-23 DIAGNOSIS — I499 Cardiac arrhythmia, unspecified: Secondary | ICD-10-CM | POA: Diagnosis not present

## 2020-11-23 DIAGNOSIS — K5791 Diverticulosis of intestine, part unspecified, without perforation or abscess with bleeding: Secondary | ICD-10-CM

## 2020-11-23 DIAGNOSIS — K5731 Diverticulosis of large intestine without perforation or abscess with bleeding: Principal | ICD-10-CM | POA: Diagnosis present

## 2020-11-23 DIAGNOSIS — K644 Residual hemorrhoidal skin tags: Secondary | ICD-10-CM | POA: Diagnosis present

## 2020-11-23 DIAGNOSIS — Z79899 Other long term (current) drug therapy: Secondary | ICD-10-CM

## 2020-11-23 DIAGNOSIS — I10 Essential (primary) hypertension: Secondary | ICD-10-CM | POA: Diagnosis present

## 2020-11-23 DIAGNOSIS — E861 Hypovolemia: Secondary | ICD-10-CM | POA: Diagnosis present

## 2020-11-23 DIAGNOSIS — E119 Type 2 diabetes mellitus without complications: Secondary | ICD-10-CM | POA: Diagnosis present

## 2020-11-23 DIAGNOSIS — K625 Hemorrhage of anus and rectum: Secondary | ICD-10-CM

## 2020-11-23 DIAGNOSIS — K641 Second degree hemorrhoids: Secondary | ICD-10-CM | POA: Diagnosis present

## 2020-11-23 DIAGNOSIS — Z880 Allergy status to penicillin: Secondary | ICD-10-CM

## 2020-11-23 DIAGNOSIS — F419 Anxiety disorder, unspecified: Secondary | ICD-10-CM | POA: Diagnosis present

## 2020-11-23 DIAGNOSIS — R040 Epistaxis: Secondary | ICD-10-CM | POA: Diagnosis present

## 2020-11-23 DIAGNOSIS — N179 Acute kidney failure, unspecified: Secondary | ICD-10-CM | POA: Diagnosis present

## 2020-11-23 DIAGNOSIS — Z7982 Long term (current) use of aspirin: Secondary | ICD-10-CM

## 2020-11-23 DIAGNOSIS — K5900 Constipation, unspecified: Secondary | ICD-10-CM | POA: Diagnosis present

## 2020-11-23 DIAGNOSIS — Z96651 Presence of right artificial knee joint: Secondary | ICD-10-CM | POA: Diagnosis present

## 2020-11-23 DIAGNOSIS — E876 Hypokalemia: Secondary | ICD-10-CM | POA: Diagnosis present

## 2020-11-23 DIAGNOSIS — D62 Acute posthemorrhagic anemia: Secondary | ICD-10-CM | POA: Diagnosis present

## 2020-11-23 DIAGNOSIS — I493 Ventricular premature depolarization: Secondary | ICD-10-CM | POA: Diagnosis present

## 2020-11-23 DIAGNOSIS — K635 Polyp of colon: Secondary | ICD-10-CM | POA: Diagnosis present

## 2020-11-23 DIAGNOSIS — F1290 Cannabis use, unspecified, uncomplicated: Secondary | ICD-10-CM | POA: Diagnosis present

## 2020-11-23 DIAGNOSIS — Z20822 Contact with and (suspected) exposure to covid-19: Secondary | ICD-10-CM | POA: Diagnosis present

## 2020-11-23 DIAGNOSIS — Z87891 Personal history of nicotine dependence: Secondary | ICD-10-CM

## 2020-11-23 DIAGNOSIS — Z87442 Personal history of urinary calculi: Secondary | ICD-10-CM

## 2020-11-23 DIAGNOSIS — K219 Gastro-esophageal reflux disease without esophagitis: Secondary | ICD-10-CM | POA: Diagnosis present

## 2020-11-23 DIAGNOSIS — I77819 Aortic ectasia, unspecified site: Secondary | ICD-10-CM | POA: Diagnosis present

## 2020-11-23 LAB — BASIC METABOLIC PANEL
Anion gap: 10 (ref 5–15)
BUN: 15 mg/dL (ref 8–23)
CO2: 24 mmol/L (ref 22–32)
Calcium: 8.8 mg/dL — ABNORMAL LOW (ref 8.9–10.3)
Chloride: 104 mmol/L (ref 98–111)
Creatinine, Ser: 1.15 mg/dL (ref 0.61–1.24)
GFR, Estimated: >60 mL/min (ref >60)
Glucose, Bld: 225 mg/dL — ABNORMAL HIGH (ref 70–99)
Potassium: 3.8 mmol/L (ref 3.5–5.1)
Sodium: 138 mmol/L (ref 135–145)

## 2020-11-23 LAB — CBC
HCT: 35.3 % — ABNORMAL LOW (ref 39.0–52.0)
Hemoglobin: 11.4 g/dL — ABNORMAL LOW (ref 13.0–17.0)
MCH: 26.3 pg (ref 26.0–34.0)
MCHC: 32.3 g/dL (ref 30.0–36.0)
MCV: 81.5 fL (ref 80.0–100.0)
Platelets: 259 10*3/uL (ref 150–400)
RBC: 4.33 MIL/uL (ref 4.22–5.81)
RDW: 15.7 % — ABNORMAL HIGH (ref 11.5–15.5)
WBC: 11 10*3/uL — ABNORMAL HIGH (ref 4.0–10.5)
nRBC: 0 % (ref 0.0–0.2)

## 2020-11-23 LAB — HEPATIC FUNCTION PANEL
ALT: 17 U/L (ref 0–44)
AST: 19 U/L (ref 15–41)
Albumin: 3.5 g/dL (ref 3.5–5.0)
Alkaline Phosphatase: 75 U/L (ref 38–126)
Bilirubin, Direct: <0.1 mg/dL (ref 0.0–0.2)
Total Bilirubin: 0.6 mg/dL (ref 0.3–1.2)
Total Protein: 6.1 g/dL — ABNORMAL LOW (ref 6.5–8.1)

## 2020-11-23 LAB — POC OCCULT BLOOD, ED: Fecal Occult Bld: POSITIVE — AB

## 2020-11-23 LAB — CBG MONITORING, ED
Glucose-Capillary: 189 mg/dL — ABNORMAL HIGH (ref 70–99)
Glucose-Capillary: 214 mg/dL — ABNORMAL HIGH (ref 70–99)

## 2020-11-23 LAB — TROPONIN I (HIGH SENSITIVITY)
Troponin I (High Sensitivity): 58 ng/L — ABNORMAL HIGH (ref <18)
Troponin I (High Sensitivity): 52 ng/L — ABNORMAL HIGH (ref <18)

## 2020-11-23 LAB — POC SARS CORONAVIRUS 2 AG -  ED: SARS Coronavirus 2 Ag: NEGATIVE

## 2020-11-23 MED ORDER — ASPIRIN 81 MG PO CHEW
81.0000 mg | CHEWABLE_TABLET | Freq: Every day | ORAL | Status: DC
  Filled 2020-11-23: qty 1

## 2020-11-23 MED ORDER — ASPIRIN 81 MG PO CHEW: 81.0000 mg | CHEWABLE_TABLET | Freq: Two times a day (BID) | ORAL | Status: DC

## 2020-11-23 MED ORDER — ACETAMINOPHEN 650 MG RE SUPP: 650.0000 mg | Freq: Four times a day (QID) | RECTAL | Status: DC | PRN

## 2020-11-23 MED ORDER — ACETAMINOPHEN 325 MG PO TABS: 650.0000 mg | ORAL_TABLET | Freq: Four times a day (QID) | ORAL | Status: DC | PRN

## 2020-11-23 MED ORDER — SODIUM CHLORIDE 0.9% FLUSH
3.0000 mL | Freq: Two times a day (BID) | INTRAVENOUS | Status: DC
  Administered 2020-11-25 – 2020-11-27 (×2): 3 mL via INTRAVENOUS

## 2020-11-23 MED ORDER — SENNOSIDES-DOCUSATE SODIUM 8.6-50 MG PO TABS: 1.0000 | ORAL_TABLET | Freq: Every evening | ORAL | Status: DC | PRN

## 2020-11-23 MED ORDER — INSULIN ASPART 100 UNIT/ML ~~LOC~~ SOLN
0.0000 [IU] | Freq: Three times a day (TID) | SUBCUTANEOUS | Status: DC
  Administered 2020-11-24: 1 [IU] via SUBCUTANEOUS
  Administered 2020-11-25: 3 [IU] via SUBCUTANEOUS
  Administered 2020-11-25 – 2020-11-26 (×3): 2 [IU] via SUBCUTANEOUS
  Administered 2020-11-26: 5 [IU] via SUBCUTANEOUS
  Administered 2020-11-27: 1 [IU] via SUBCUTANEOUS
  Administered 2020-11-27: 2 [IU] via SUBCUTANEOUS
  Administered 2020-11-27: 1 [IU] via SUBCUTANEOUS

## 2020-11-23 MED ORDER — ENOXAPARIN SODIUM 40 MG/0.4ML ~~LOC~~ SOLN
40.0000 mg | SUBCUTANEOUS | Status: DC
  Administered 2020-11-23: 40 mg via SUBCUTANEOUS
  Filled 2020-11-23: qty 0.4
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
## 2020-11-23 NOTE — Consult Note (Signed)
Cardiology Consultation:   Patient ID: Jon Robinson. MRN: [id number]; DOB: [birth year]  Admit date: 11/23/2020 Date of Consult: 11/23/2020  Primary Care Provider: Donita Brooks, MD [facility] Cardiologist: No primary care provider on file.  [facility] Electrophysiologist:  None   Patient Profile:   Mr. Billiter is a 87M with DM2, GERD, HTN, and marijuana use who presented to the ED after witnessed syncopal episode by his friend followed by recurrent LOC in the ED.    History of Present Illness:   Mr. Retterer reports that this past Thursday he started noticing bright red blood when he wiped that it was a small bowel ongoing piece of toilet paper and then would go away.  He said last night he did not sleep well at all and then woke up this morning when he went to the restroom the entire time with was cherry red.  He had 2 bowel occurrences, both of which the toilet was cherry red.  He reports one distant episode of possible syncope while he was in the service however this was completely different than his experiences today.  He was having one of his acquaintances over to smoke marijuana this afternoon.  After she arrived he started to smoke with her he felt acute onset of lightheadedness and stood up to walk towards his chair.  He said the next thing he remembers was waking up with his appointments and another friend standing above him say that they were going to call an ambulance.  At this point he tried to get up to go to the restroom as he felt he needed to have another bowel movement.  After going to the restroom he had another cherry red bowel movement and then was taken to the ED for further evaluation.  He reports that he briefly had loss of consciousness at least 3 times in the ED.  He thought that these occurred while he was in the restroom before he was actually having a bowel movement.  He has somewhat of a manic affect while explaining his story and is very tangential.  He  denies any other current nonprescription drug use other than marijuana although he does say that he has remote history of cocaine use.  Past Medical History:  Diagnosis Date  . Arthritis   . Diabetes mellitus without complication ([place])    Diet controlled  . GERD (gastroesophageal reflux disease)   . Headache   . History of kidney stones   . Hypertension   . Neuromuscular disorder ([place])    Finger tips numbness/tingling   Past Surgical History:  Procedure Laterality Date  . FRACTURE SURGERY  1972   Wrist   . KIDNEY STONE SURGERY  10/1997  . KNEE ARTHROPLASTY Right 12/30/2017   Procedure: RIGHT TOTAL KNEE ARTHROPLASTY;  Surgeon: Rod Can, MD;  Location: [facility];  Service: Orthopedics;  Laterality: Right;  Needs RNFA    Home Medications:  Prior to Admission medications   Medication Sig Start Date End Date Taking? Authorizing Provider  Ascorbic Acid (VITAMIN C) 1000 MG tablet Take 1,000 mg by mouth daily.    [provider]  aspirin 81 MG chewable tablet Chew 1 tablet (81 mg total) by mouth 2 (two) times daily. 12/31/17   Swinteck, Aaron Edelman, MD  docusate sodium (COLACE) 100 MG capsule Take 1 capsule (100 mg total) by mouth 2 (two) times daily. 12/31/17   Swinteck, Aaron Edelman, MD  hydrochlorothiazide (HYDRODIURIL) 25 MG tablet Take 25 mg by mouth daily.  [provider]  loratadine (ALLERGY RELIEF) 10 MG tablet Take 10 mg by mouth daily. ALLERGY RELIEF    [provider]  methocarbamol (ROBAXIN) 500 MG tablet Take 500 mg by mouth 4 (four) times daily as needed for muscle spasms.    [provider]  ondansetron (ZOFRAN) 4 MG tablet Take 1 tablet (4 mg total) by mouth every 6 (six) hours as needed for nausea. 12/31/17   Swinteck, Aaron Edelman, MD  oxyCODONE (OXY IR/ROXICODONE) 5 MG immediate release tablet Take 1-2 tablets (5-10 mg total) by mouth every 4 (four) hours as needed. 12/31/17   Swinteck, Aaron Edelman, MD  oxyCODONE (OXYCONTIN) 10 mg 12 hr tablet Take 1 tablet (10 mg  total) by mouth PRO. 1 tab PO every 12 hours for 3 days, then 1 tab PO daily for 4 days 12/31/17   Rod Can, MD  pyridOXINE (VITAMIN B-6) 100 MG tablet Take 200 mg by mouth daily.    [provider]  senna (SENOKOT) 8.6 MG TABS tablet Take 2 tablets (17.2 mg total) by mouth at bedtime. 12/31/17   Swinteck, Aaron Edelman, MD  Trolamine Salicylate (ASPERCREME) 10 % LOTN Apply 1 application topically 2 (two) times daily as needed (pain in hands).    [provider]    Inpatient Medications: Scheduled Meds: . [START ON 11/24/2020] aspirin  81 mg Oral Daily  . enoxaparin (LOVENOX) injection  40 mg Subcutaneous Q24H  . [START ON 11/24/2020] insulin aspart  0-9 Units Subcutaneous TID WC  . sodium chloride flush  3 mL Intravenous Q12H   Continuous Infusions:  PRN Meds: acetaminophen **OR** acetaminophen, senna-docusate  Allergies:    Allergies  Allergen Reactions  . Novocain [Procaine] Swelling    In the face  . Penicillins Other (See Comments)   Social History:   Social History   Socioeconomic History  . Marital status: Widowed    Spouse name: Not on file  . Number of children: Not on file  . Years of education: Not on file  . Highest education level: Not on file  Occupational History  . Not on file  Tobacco Use  . Smoking status: Former Smoker    Packs/day: 0.50    Years: 25.00    Pack years: 12.50    Types: Cigarettes    Quit date: 12/28/2016    Years since quitting: 3.9  . Smokeless tobacco: Never Used  Vaping Use  . Vaping Use: Never used  Substance and Sexual Activity  . Alcohol use: Yes    Alcohol/week: 2.0 - 3.0 standard drinks    Types: 1 - 2 Cans of beer, 1 Shots of liquor per week    Comment: Weekly  . 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 Sexual activity: Not on file  Other Topics Concern  . Not on file  Social History Narrative  . Not on file   Social  Determinants of Health   Financial Resource Strain: Not on file  Food Insecurity: Not on file  Transportation Needs: Not on file  Physical Activity: Not on file  Stress: Not on file  Social Connections: Not on file  Intimate Partner Violence: Not on file    Family History:   No family hx of premature CAD or SCD  ROS:  Review of Systems: [y] = yes, [ ]  = no       General: Weight gain [ ] ; Weight loss [ ] ;  Anorexia [ ] ; Fatigue [ ] ; Fever [ ] ; Chills [ ] ; Weakness [ ]     Cardiac: Chest pain/pressure [ ] ; Resting SOB [ ] ; Exertional SOB [ ] ; Orthopnea [ ] ; Pedal Edema [ ] ; Palpitations [ ] ; Syncope [y]; Presyncope [y]; Paroxysmal nocturnal dyspnea [ ]     Pulmonary: Cough [ ] ; Wheezing [ ] ; Hemoptysis [ ] ; Sputum [ ] ; Snoring [ ]     GI: Vomiting [ ] ; Dysphagia [ ] ; Melena [ ] ; Hematochezia [ ] ; Heartburn [ ] ; Abdominal pain [ ] ; Constipation [ ] ; Diarrhea [ ] ; BRBPR [ ]     GU: Hematuria [ ] ; Dysuria [ ] ; Nocturia [ ]   Vascular: Pain in legs with walking [ ] ; Pain in feet with lying flat [ ] ; Non-healing sores [ ] ; Stroke [ ] ; TIA [ ] ; Slurred speech [ ] ;    Neuro: Headaches [ ] ; Vertigo [ ] ; Seizures [ ] ; Paresthesias [ ] ;Blurred vision [ ] ; Diplopia [ ] ; Vision changes [ ]     Ortho/Skin: Arthritis [ ] ; Joint pain [ ] ; Muscle pain [ ] ; Joint swelling [ ] ; Back Pain [ ] ; Rash [ ]     Psych: Depression [ ] ; Anxiety [ ]     Heme: Bleeding problems [ ] ; Clotting disorders [ ] ; Anemia [ ]     Endocrine: Diabetes [ ] ; Thyroid dysfunction [ ]    Physical Exam/Data:   Vitals:   11/23/20 1745 11/23/20 2030 11/23/20 2045 11/23/20 2100  BP: (!) 140/95     Pulse: (!) 103 89 79 82  Resp: 18 15 14 14   Temp:      TempSrc:      SpO2: 100% 94% 93% 93%   No intake or output data in the 24 hours ending 11/23/20 2217 Last 3 Weights 03/22/2018 12/30/2017 12/23/2017  Weight (lbs) 210 lb 214 lb 214 lb 2 oz  Weight (kg) 95.255 kg 97.07 kg 97.126 kg     There is no height or weight on file to  calculate BMI.   General:  Well nourished, well developed, in no acute distress HEENT: normal Lymph: no adenopathy Neck: no JVD Endocrine:  No thryomegaly Vascular: No carotid bruits; FA pulses 2+ bilaterally without bruits  Cardiac:  normal S1, S2; RRR; no murmur  Lungs:  clear to auscultation bilaterally, no wheezing, rhonchi or rales  Abd: soft, nontender, no hepatomegaly  Ext: no edema Musculoskeletal:  No deformities, BUE and BLE strength normal and equal Skin: warm and dry  Neuro:  CNs 2-12 intact, no focal abnormalities noted Psych:  Manic   EKG:  The EKG was personally reviewed and demonstrates: NSR with isolated blocked PAC (11/23/20, 16:27:12) and one PVC  Telemetry:  Telemetry was personally reviewed and demonstrates: alternating NSR and sinus tachycardia (HR variable 90-140s)   Relevant CV Studies: None   Laboratory Data:  High Sensitivity Troponin:   Recent Labs  Lab 11/23/20 1720 11/23/20 1933  TROPONINIHS 58* 52*     Chemistry Recent Labs  Lab 11/23/20 1416  NA 138  K 3.8  CL 104  CO2 24  GLUCOSE 225*  BUN 15  CREATININE 1.15  CALCIUM 8.8*  GFRNONAA >60  ANIONGAP 10    Recent Labs  Lab 11/23/20 1646  PROT 6.1*  ALBUMIN 3.5  AST 19  ALT 17  ALKPHOS 75  BILITOT 0.6   Hematology Recent Labs  Lab 11/23/20 1416  WBC 11.0*  RBC 4.33  HGB 11.4*  HCT 35.3*  MCV 81.5  MCH 26.3  MCHC  32.3  RDW 15.7*  PLT 259   BNPNo results for input(s): BNP, PROBNP in the last 168 hours.  DDimer No results for input(s): DDIMER in the last 168 hours.  Radiology/Studies:  CT Head Wo Contrast  Result Date: 11/23/2020 CLINICAL DATA:  Syncope, simple, abnormal neuro exam EXAM: CT HEAD WITHOUT CONTRAST TECHNIQUE: Contiguous axial images were obtained from the base of the skull through the vertex without intravenous contrast. COMPARISON:  None. FINDINGS: Brain: Brain volume is normal for age. No intracranial hemorrhage, mass effect, or midline shift. No  hydrocephalus. The basilar cisterns are patent. There is moderate periventricular and deep white matter hypodensity. No evidence of territorial infarct or acute ischemia. No extra-axial or intracranial fluid collection. Vascular: No hyperdense vessel. Skull: No fracture or focal lesion. Sinuses/Orbits: Scattered mucosal thickening throughout the ethmoid air cells and maxillary sinuses. No sinus fluid level. Orbits are unremarkable. Mastoid air cells are clear. Other: None. IMPRESSION: 1. No acute intracranial abnormality. 2. Moderate periventricular and deep white matter hypodensity, nonspecific but typically chronic small vessel ischemia. Electronically Signed   By: Keith Rake M.D.   On: 11/23/2020 17:54   Assessment and Plan:   Mr. Hallquist is a 69M with DM2, GERD, HTN, and marijuana use who presented to the ED after witnessed syncopal episode by his friend followed by recurrent LOC in the ED. Mr. Holcomb has no prior coronary disease but he does have risk factors for denies any recent chest pain.  Besides what was likely a vagal event in the distant past reports no recent syncopal events until today.  He denies syncopal episodes do seem consistent with a cardiac cause.  Unfortunately he was normal monitor today when he had his events in the restroom.  I attempted to talk to his friend to get a better idea what happened during his syncopal event at home.  He said the only way I can contact her was through apps on his phone like instagram and that she did not have a cell phone herself.  Baseline ECG with an isolated PVC and a blocked PAC but no obvious high degree AV block that would explain his syncopal episodes.  Review of telemetry with sinus tachycardia.  For now we will monitor telemetry and see if any recurrent events.  Vasovagal syncope in the setting of GI bleed and recurrent bloody bowel movements this is the most likely diagnosis but please page any recurrent syncopal episodes. Hb stable on admission  (11.4) from prior (10-13).   For questions or updates, please contact [place] Please consult [email or web address] for contact info under   Signed, Dion Body, MD  11/23/2020 10:17 PM

## 2020-11-23 NOTE — Consult Note (Signed)
Cardiology Consultation:   Patient ID: Janann August. MRN: [id number]; DOB: [birth year]  Admit date: 11/23/2020 Date of Consult: 11/23/2020  Primary Care Provider: Donita Brooks, MD [facility] Cardiologist: No primary care provider on file.  [facility] Electrophysiologist:  None   Patient Profile:   Mr. Deitrich is a 65M with DM2, GERD, HTN, and marijuana use who presented to the ED after witnessed syncopal episode by his friend followed by recurrent LOC in the ED.    History of Present Illness:   Mr. Burningham reports that this past Thursday he started noticing bright red blood when he wiped that it was a small bowel ongoing piece of toilet paper and then would go away.  He said last night he did not sleep well at all and then woke up this morning when he went to the restroom the entire time with was cherry red.  He had 2 bowel occurrences, both of which the toilet was cherry red.  He reports one distant episode of possible syncope while he was in the service however this was completely different than his experiences today.  He was having one of his acquaintances over to smoke marijuana this afternoon.  After she arrived he started to smoke with her he felt acute onset of lightheadedness and stood up to walk towards his chair.  He said the next thing he remembers was waking up with his appointments and another friend standing above him say that they were going to call an ambulance.  At this point he tried to get up to go to the restroom as he felt he needed to have another bowel movement.  After going to the restroom he had another cherry red bowel movement and then was taken to the ED for further evaluation.  He reports that he briefly had loss of consciousness at least 3 times in the ED.  He thought that these occurred while he was in the restroom before he was actually having a bowel movement.  He has somewhat of a manic affect while explaining his story and is very tangential.  He  denies any other current nonprescription drug use other than marijuana although he does say that he has remote history of cocaine use.  Past Medical History:  Diagnosis Date  . Arthritis   . Diabetes mellitus without complication ([place])    Diet controlled  . GERD (gastroesophageal reflux disease)   . Headache   . History of kidney stones   . Hypertension   . Neuromuscular disorder ([place])    Finger tips numbness/tingling   Past Surgical History:  Procedure Laterality Date  . FRACTURE SURGERY  1972   Wrist   . KIDNEY STONE SURGERY  10/1997  . KNEE ARTHROPLASTY Right 12/30/2017   Procedure: RIGHT TOTAL KNEE ARTHROPLASTY;  Surgeon: Rod Can, MD;  Location: [facility];  Service: Orthopedics;  Laterality: Right;  Needs RNFA    Home Medications:  Prior to Admission medications   Medication Sig Start Date End Date Taking? Authorizing Provider  Ascorbic Acid (VITAMIN C) 1000 MG tablet Take 1,000 mg by mouth daily.    [provider]  aspirin 81 MG chewable tablet Chew 1 tablet (81 mg total) by mouth 2 (two) times daily. 12/31/17   Swinteck, Aaron Edelman, MD  docusate sodium (COLACE) 100 MG capsule Take 1 capsule (100 mg total) by mouth 2 (two) times daily. 12/31/17   Swinteck, Aaron Edelman, MD  hydrochlorothiazide (HYDRODIURIL) 25 MG tablet Take 25 mg by mouth daily.  [provider]  loratadine (ALLERGY RELIEF) 10 MG tablet Take 10 mg by mouth daily. ALLERGY RELIEF    [provider]  methocarbamol (ROBAXIN) 500 MG tablet Take 500 mg by mouth 4 (four) times daily as needed for muscle spasms.    [provider]  ondansetron (ZOFRAN) 4 MG tablet Take 1 tablet (4 mg total) by mouth every 6 (six) hours as needed for nausea. 12/31/17   Swinteck, Aaron Edelman, MD  oxyCODONE (OXY IR/ROXICODONE) 5 MG immediate release tablet Take 1-2 tablets (5-10 mg total) by mouth every 4 (four) hours as needed. 12/31/17   Swinteck, Aaron Edelman, MD  oxyCODONE (OXYCONTIN) 10 mg 12 hr tablet Take 1 tablet (10 mg  total) by mouth PRO. 1 tab PO every 12 hours for 3 days, then 1 tab PO daily for 4 days 12/31/17   Rod Can, MD  pyridOXINE (VITAMIN B-6) 100 MG tablet Take 200 mg by mouth daily.    [provider]  senna (SENOKOT) 8.6 MG TABS tablet Take 2 tablets (17.2 mg total) by mouth at bedtime. 12/31/17   Swinteck, Aaron Edelman, MD  Trolamine Salicylate (ASPERCREME) 10 % LOTN Apply 1 application topically 2 (two) times daily as needed (pain in hands).    [provider]    Inpatient Medications: Scheduled Meds: . [START ON 11/24/2020] aspirin  81 mg Oral Daily  . enoxaparin (LOVENOX) injection  40 mg Subcutaneous Q24H  . [START ON 11/24/2020] insulin aspart  0-9 Units Subcutaneous TID WC  . sodium chloride flush  3 mL Intravenous Q12H   Continuous Infusions:  PRN Meds: acetaminophen **OR** acetaminophen, senna-docusate  Allergies:    Allergies  Allergen Reactions  . Novocain [Procaine] Swelling    In the face  . Penicillins Other (See Comments)   Social History:   Social History   Socioeconomic History  . Marital status: Widowed    Spouse name: Not on file  . Number of children: Not on file  . Years of education: Not on file  . Highest education level: Not on file  Occupational History  . Not on file  Tobacco Use  . Smoking status: Former Smoker    Packs/day: 0.50    Years: 25.00    Pack years: 12.50    Types: Cigarettes    Quit date: 12/28/2016    Years since quitting: 3.9  . Smokeless tobacco: Never Used  Vaping Use  . Vaping Use: Never used  Substance and Sexual Activity  . Alcohol use: Yes    Alcohol/week: 2.0 - 3.0 standard drinks    Types: 1 - 2 Cans of beer, 1 Shots of liquor per week    Comment: Weekly  . 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 Sexual activity: Not on file  Other Topics Concern  . Not on file  Social History Narrative  . Not on file   Social  Determinants of Health   Financial Resource Strain: Not on file  Food Insecurity: Not on file  Transportation Needs: Not on file  Physical Activity: Not on file  Stress: Not on file  Social Connections: Not on file  Intimate Partner Violence: Not on file    Family History:   No family hx of premature CAD or SCD  ROS:  Review of Systems: [y] = yes, [ ]  = no       General: Weight gain [ ] ; Weight loss [ ] ;  Anorexia [ ] ; Fatigue [ ] ; Fever [ ] ; Chills [ ] ; Weakness [ ]     Cardiac: Chest pain/pressure [ ] ; Resting SOB [ ] ; Exertional SOB [ ] ; Orthopnea [ ] ; Pedal Edema [ ] ; Palpitations [ ] ; Syncope [y]; Presyncope [y]; Paroxysmal nocturnal dyspnea [ ]     Pulmonary: Cough [ ] ; Wheezing [ ] ; Hemoptysis [ ] ; Sputum [ ] ; Snoring [ ]     GI: Vomiting [ ] ; Dysphagia [ ] ; Melena [ ] ; Hematochezia [ ] ; Heartburn [ ] ; Abdominal pain [ ] ; Constipation [ ] ; Diarrhea [ ] ; BRBPR [ ]     GU: Hematuria [ ] ; Dysuria [ ] ; Nocturia [ ]   Vascular: Pain in legs with walking [ ] ; Pain in feet with lying flat [ ] ; Non-healing sores [ ] ; Stroke [ ] ; TIA [ ] ; Slurred speech [ ] ;    Neuro: Headaches [ ] ; Vertigo [ ] ; Seizures [ ] ; Paresthesias [ ] ;Blurred vision [ ] ; Diplopia [ ] ; Vision changes [ ]     Ortho/Skin: Arthritis [ ] ; Joint pain [ ] ; Muscle pain [ ] ; Joint swelling [ ] ; Back Pain [ ] ; Rash [ ]     Psych: Depression [ ] ; Anxiety [ ]     Heme: Bleeding problems [ ] ; Clotting disorders [ ] ; Anemia [ ]     Endocrine: Diabetes [ ] ; Thyroid dysfunction [ ]    Physical Exam/Data:   Vitals:   11/23/20 1745 11/23/20 2030 11/23/20 2045 11/23/20 2100  BP: (!) 140/95     Pulse: (!) 103 89 79 82  Resp: 18 15 14 14   Temp:      TempSrc:      SpO2: 100% 94% 93% 93%   No intake or output data in the 24 hours ending 11/23/20 2217 Last 3 Weights 03/22/2018 12/30/2017 12/23/2017  Weight (lbs) 210 lb 214 lb 214 lb 2 oz  Weight (kg) 95.255 kg 97.07 kg 97.126 kg     There is no height or weight on file to  calculate BMI.   General:  Well nourished, well developed, in no acute distress HEENT: normal Lymph: no adenopathy Neck: no JVD Endocrine:  No thryomegaly Vascular: No carotid bruits; FA pulses 2+ bilaterally without bruits  Cardiac:  normal S1, S2; RRR; no murmur  Lungs:  clear to auscultation bilaterally, no wheezing, rhonchi or rales  Abd: soft, nontender, no hepatomegaly  Ext: no edema Musculoskeletal:  No deformities, BUE and BLE strength normal and equal Skin: warm and dry  Neuro:  CNs 2-12 intact, no focal abnormalities noted Psych:  Manic   EKG:  The EKG was personally reviewed and demonstrates: NSR with isolated blocked PAC (11/23/20, 16:27:12) and one PVC  Telemetry:  Telemetry was personally reviewed and demonstrates: alternating NSR and sinus tachycardia (HR variable 90-140s)   Relevant CV Studies: None   Laboratory Data:  High Sensitivity Troponin:   Recent Labs  Lab 11/23/20 1720 11/23/20 1933  TROPONINIHS 58* 52*     Chemistry Recent Labs  Lab 11/23/20 1416  NA 138  K 3.8  CL 104  CO2 24  GLUCOSE 225*  BUN 15  CREATININE 1.15  CALCIUM 8.8*  GFRNONAA >60  ANIONGAP 10    Recent Labs  Lab 11/23/20 1646  PROT 6.1*  ALBUMIN 3.5  AST 19  ALT 17  ALKPHOS 75  BILITOT 0.6   Hematology Recent Labs  Lab 11/23/20 1416  WBC 11.0*  RBC 4.33  HGB 11.4*  HCT 35.3*  MCV 81.5  MCH 26.3  MCHC  32.3  RDW 15.7*  PLT 259   BNPNo results for input(s): BNP, PROBNP in the last 168 hours.  DDimer No results for input(s): DDIMER in the last 168 hours.  Radiology/Studies:  CT Head Wo Contrast  Result Date: 11/23/2020 CLINICAL DATA:  Syncope, simple, abnormal neuro exam EXAM: CT HEAD WITHOUT CONTRAST TECHNIQUE: Contiguous axial images were obtained from the base of the skull through the vertex without intravenous contrast. COMPARISON:  None. FINDINGS: Brain: Brain volume is normal for age. No intracranial hemorrhage, mass effect, or midline shift. No  hydrocephalus. The basilar cisterns are patent. There is moderate periventricular and deep white matter hypodensity. No evidence of territorial infarct or acute ischemia. No extra-axial or intracranial fluid collection. Vascular: No hyperdense vessel. Skull: No fracture or focal lesion. Sinuses/Orbits: Scattered mucosal thickening throughout the ethmoid air cells and maxillary sinuses. No sinus fluid level. Orbits are unremarkable. Mastoid air cells are clear. Other: None. IMPRESSION: 1. No acute intracranial abnormality. 2. Moderate periventricular and deep white matter hypodensity, nonspecific but typically chronic small vessel ischemia. Electronically Signed   By: Keith Rake M.D.   On: 11/23/2020 17:54   Assessment and Plan:   Mr. Hallquist is a 69M with DM2, GERD, HTN, and marijuana use who presented to the ED after witnessed syncopal episode by his friend followed by recurrent LOC in the ED. Mr. Holcomb has no prior coronary disease but he does have risk factors for denies any recent chest pain.  Besides what was likely a vagal event in the distant past reports no recent syncopal events until today.  He denies syncopal episodes do seem consistent with a cardiac cause.  Unfortunately he was normal monitor today when he had his events in the restroom.  I attempted to talk to his friend to get a better idea what happened during his syncopal event at home.  He said the only way I can contact her was through apps on his phone like instagram and that she did not have a cell phone herself.  Baseline ECG with an isolated PVC and a blocked PAC but no obvious high degree AV block that would explain his syncopal episodes.  Review of telemetry with sinus tachycardia.  For now we will monitor telemetry and see if any recurrent events.  Vasovagal syncope in the setting of GI bleed and recurrent bloody bowel movements this is the most likely diagnosis but please page any recurrent syncopal episodes. Hb stable on admission  (11.4) from prior (10-13).   For questions or updates, please contact [place] Please consult [email or web address] for contact info under   Signed, Dion Body, MD  11/23/2020 10:17 PM

## 2020-11-23 NOTE — ED Provider Notes (Signed)
Medical screening examination/treatment/procedure(s) were conducted as a shared visit with non-physician practitioner(s) and myself.  I personally evaluated the patient during the encounter.  Clinical Impression:   Final diagnoses:  Cardiac arrhythmia, unspecified cardiac arrhythmia type    Multiple bloody BM's today - has been syncopizing at home and in the waiting room - has had a MJ use today - Hgb is 11.4, BMP is unremarkable, added LFT's, EKG is unremarkable - having some pauses on the monitor frequently - frequent sinuses pauses.  Multiple syncopal episodes- frequent sinus pauses - pt will need cardiac monitoring and trending of his Hgb.  Not anticoagulated  Anticipate admission due to possible arrhythmia  Hemoglobin  Date Value Ref Range Status  11/24/2020 10.6 (L) 13.0 - 17.0 g/dL Final  11/23/2020 11.4 (L) 13.0 - 17.0 g/dL Final  01/01/2018 10.5 (L) 13.0 - 17.0 g/dL Final  12/31/2017 11.7 (L) 13.0 - 17.0 g/dL Final    EKG Interpretation  Date/Time:  Saturday November 23 2020 16:27:12 EST Ventricular Rate:  91 PR Interval:    QRS Duration: 92 QT Interval:  358 QTC Calculation: 441 R Axis:   48 Text Interpretation: Sinus rhythm Multiple premature complexes, vent & supraven Sinus pause since last tracing no significant change Confirmed by Noemi Chapel [phone]) on 11/23/2020 4:32:24 PM       Medical screening examination/treatment/procedure(s) were conducted as a shared visit with non-physician practitioner(s) and myself.  I personally evaluated the patient during the encounter.  Clinical Impression:   Final diagnoses:  Cardiac arrhythmia, unspecified cardiac arrhythmia type         Noemi Chapel, MD 11/24/20 1450

## 2020-11-23 NOTE — H&P (Addendum)
Date: 11/23/2020               Patient Name:  Jon Robinson. MRN: [id number]  DOB: 09/23/1951 Age / Sex: 70 y.o., male   PCP: Donita Brooks, MD         Medical Service: Internal Medicine Teaching Service         Attending Physician: Dr. Dareen Piano    First Contact: Dr. Wynetta Emery Pager: [phone]  Second Contact: Dr. Sharon Seller  Pager: [phone]       After Hours (After 5p/  First Contact Pager: [phone]  weekends / holidays): Second Contact Pager: [phone]   Chief Complaint: syncope, BRBPR  History of Present Illness:   Jon Robinson. Is a 70yo male with PMH of HTN, nephrolithiasis, GERD, and type II diabetes presenting after two syncopal events. Patient is extremely tangential, but I am not sure if he is acutely intoxicated or not. He does that he feels "spacy" and "cloudy" currently. The first syncopal episode occurred at home. He took 3 pulls from marijuana (no different than his usual supply, denies other recent drug use) and then stood up and felt dizzy. He sat down in a chair, then next thing he knew, he was waking up and noted his hands were wet. An observer told him he was foaming at the mouth, no motor activity noted. The second episode happened in the ED waiting room. States he was having to strain to urinate, then felt dizzy so he say on the toilet and passed out. He awoke to people helping him up. Reports only 1 prior episode of syncope when he was a young man, he was in the [employer] and stood up quickly for mealtime then passed out.  He notes some bright red blood per rectum, starting 2 days ago. First noticed some blood on the toilet paper "like a nose bleed" present on the first wipe only. Then this morning had bright red blood in the toilet bowl. Notes that he is alternates between diarrhea and constipation, and was constipated earlier this week then had diarrhea this morning.   When prompted about chest pain, he reports a feeling of "cramping" in his chest that occurs a few  times a week over the past year. It lasts for only a few seconds and occurs randomly. He separately sometimes has nausea but only when he eats. He also has a lot of belching when this happens. He has been more short of breath recently, but he thinks this is from anxiety. He had acid reflux in the past but he cut out acidic food, cheese, tomatoes, tomato sauces and that's gotten much better. He has had a stress test in the past which was normal. He has only passed out like this once before when he was young in [employer], he jumped up and passed out.   Social:  He lives in [place]. Used to work on a [employer]. Previously was in the [place]. He smokes marijuana. No other drugs currently. Used to use crack cocaine and snort heroin, but went through rehab twice, last in the 90s. Never used IV drugs.    Family History:  Mom - Heart Attack x2 - died at 47 from MI; TIIDM Dad - none  Meds:  Current Outpatient Medications  Medication Instructions  . aspirin 81 mg, Oral, 2 times daily  . docusate sodium (COLACE) 100 mg, Oral, 2 times daily  . hydrochlorothiazide (HYDRODIURIL) 25 mg, Oral, Daily  . loratadine (ALLERGY RELIEF) 10  mg, Oral, Daily, ALLERGY RELIEF  . methocarbamol (ROBAXIN) 500 mg, Oral, 4 times daily PRN  . ondansetron (ZOFRAN) 4 mg, Oral, Every 6 hours PRN  . oxyCODONE (OXY IR/ROXICODONE) 5-10 mg, Oral, Every 4 hours PRN  . oxyCODONE (OXYCONTIN) 10 mg, Oral, Protocol, 1 tab PO every 12 hours for 3 days, then 1 tab PO daily for 4 days  . pyridOXINE (VITAMIN B-6) 200 mg, Oral, Daily  . senna (SENOKOT) 17.2 mg, Oral, Daily at bedtime  . Trolamine Salicylate (ASPERCREME) 10 % LOTN 1 application, Apply externally, 2 times daily PRN  . vitamin C 1,000 mg, Oral, Daily     Allergies: Allergies as of 11/23/2020 - Review Complete 12/30/2017  Allergen Reaction Noted  . Novocain [procaine] Swelling 12/21/2017  . Penicillins Other (See Comments) 12/21/2017   Past Medical History:   Diagnosis Date  . Arthritis   . Diabetes mellitus without complication ([place])    Diet controlled  . GERD (gastroesophageal reflux disease)   . Headache   . History of kidney stones   . Hypertension   . Neuromuscular disorder ([place])    Finger tips numbness/tingling    Review of Systems: Review of Systems  Constitutional: Negative for chills and fever.  HENT: Positive for congestion. Negative for sore throat.   Eyes: Negative for blurred vision and double vision.  Respiratory: Positive for cough. Negative for shortness of breath.   Cardiovascular: Positive for chest pain and leg swelling. Negative for palpitations.  Gastrointestinal: Positive for blood in stool, constipation and diarrhea. Negative for abdominal pain, nausea and vomiting.  Genitourinary: Negative for dysuria.       +Difficulty urinating  Neurological: Positive for dizziness and loss of consciousness. Negative for seizures.  All other systems reviewed and are negative.    Physical Exam: Blood pressure (!) 140/95, pulse (!) 103, temperature 98.2 F (36.8 C), temperature source Oral, resp. rate 18, SpO2 100 %.   Physical Exam Vitals and nursing note reviewed.  Constitutional:      General: He is not in acute distress.    Appearance: Normal appearance.  HENT:     Head: Normocephalic and atraumatic.     Right Ear: External ear normal.     Left Ear: External ear normal.     Nose: Nose normal.     Mouth/Throat:     Mouth: Mucous membranes are moist.  Eyes:     General: No scleral icterus.    Extraocular Movements: Extraocular movements intact.     Conjunctiva/sclera: Conjunctivae normal.     Pupils: Pupils are equal, round, and reactive to light.     Comments: No conjunctival injection  Cardiovascular:     Pulses: Normal pulses.     Heart sounds: Normal heart sounds. No murmur heard. No friction rub. No gallop.      Comments: Irregular at times. At other times is in regular rhythm with occasional dropped  beats. Pulmonary:     Effort: Pulmonary effort is normal.     Breath sounds: Normal breath sounds. No wheezing, rhonchi or rales.  Abdominal:     General: Abdomen is flat. There is no distension.     Palpations: Abdomen is soft.     Tenderness: There is no abdominal tenderness. There is no guarding.  Musculoskeletal:        General: Normal range of motion.     Cervical back: Normal range of motion and neck supple.     Right lower leg: No edema.  Left lower leg: No edema.  Skin:    General: Skin is warm and dry.     Capillary Refill: Capillary refill takes less than 2 seconds.  Neurological:     General: No focal deficit present.     Mental Status: He is alert and oriented to person, place, and time.     Cranial Nerves: No cranial nerve deficit.     Motor: No weakness.  Psychiatric:     Comments: Hyperactive, tangential speech.         EKG: personally reviewed my interpretation is sinus rhythm with pauses   CT Head Wo Contrast  Result Date: 11/23/2020 CLINICAL DATA:  Syncope, simple, abnormal neuro exam EXAM: CT HEAD WITHOUT CONTRAST TECHNIQUE: Contiguous axial images were obtained from the base of the skull through the vertex without intravenous contrast. COMPARISON:  None. FINDINGS: Brain: Brain volume is normal for age. No intracranial hemorrhage, mass effect, or midline shift. No hydrocephalus. The basilar cisterns are patent. There is moderate periventricular and deep white matter hypodensity. No evidence of territorial infarct or acute ischemia. No extra-axial or intracranial fluid collection. Vascular: No hyperdense vessel. Skull: No fracture or focal lesion. Sinuses/Orbits: Scattered mucosal thickening throughout the ethmoid air cells and maxillary sinuses. No sinus fluid level. Orbits are unremarkable. Mastoid air cells are clear. Other: None. IMPRESSION: 1. No acute intracranial abnormality. 2. Moderate periventricular and deep white matter hypodensity, nonspecific but  typically chronic small vessel ischemia. Electronically Signed   By: Keith Rake M.D.   On: 11/23/2020 17:54    Assessment & Plan by Problem: Active Problems:   Syncope and collapse  Donevan Neila Robinson. Is a 69yo male with PMH of HTN, nephrolithiasis, GERD, and type II diabetes presenting after two syncopal events, one witnessed at home by his friend and another witnessed in the bathroom of the ER while he was using the urinal.  EKG with occasional pauses.  Syncope Occasional pauses on EKG Differential includes arrhythmia, vasovagal, drug intoxication. Less likely related to blood loss or neurologic in etiology. EKG shows sinus rhythm with occasional dropped beats. Patients endorses 3 second episodes of chest cramps occurring a few times a week for the past year, not related to physical activity and no related symptoms.  Potassium is normal.  No known cardiac history.  Unclear if any family history of arrhythmias.  These episodes occurred after standing up and feeling dizzy, and one was after having to strain to urinate.  Also reports smoking marijuana today but this is not unusual for him, denies other drug use but has history of polysubstance use disorder.  Neuro exam unremarkable. -Cardiology consulted, appreciate recommendations -Telemetry -Follow-up UDS, urinalysis -Follow-up echo -Follow-up TSH -Initial troponin 58, follow-up repeat -Follow-up lipid panel -Follow-up EEG  Internal hemorrhoids Had initially some blood in on toilet paper, later had red blood in toilet. Rectal exam with palpable internal hemorrhoids.  Hemoglobin of 11.4 which is stable from his prior levels.  Normocytic.  Had a colonoscopy roughly 2 years ago which he reports was normal.  He does report prior gastrointestinal issue in his youth and describes an upper GI series and acid reflux, but is unable to specify further.  No history of colon cancer.  Denies prior GI bleed.  No blood thinners.  Has alternating diarrhea  and constipation, most recently diarrhea. -Follow-up FOBT -Daily CBC -Senna as needed  HTN Home medication of HCTZ 25 mg daily. -May resume home medication  Type II diabetes Last A1c of 7.3 in February  2019.  Home medication of Metformin, possibly 750 twice a day. -SSI -Follow-up A1c  Diet: Heart healthy carb modified VTE: Lovenox IVF: None Code: Full  Dispo: Admit patient to Inpatient with expected length of stay greater than 2 midnights.  SignedMarty Heck, DO 11/23/2020, 5:43 PM  Pager: [phone]

## 2020-11-23 NOTE — ED Triage Notes (Addendum)
Pt to triage via [facility] from home.  Reports syncopal episode with seizure like activity and "foaming at the mouth" while lying on the couch after smoking a "jack frost" (marijuana) and having a shot of liquor.  Alert and oriented.  Denies pain.  18g LAC.

## 2020-11-23 NOTE — ED Provider Notes (Signed)
[facility] EMERGENCY DEPARTMENT Provider Note   CSN: [id number] Arrival date & time: 11/23/20  1357     History Chief Complaint  Patient presents with  . Loss of Consciousness    Jon Aza Laue. is a 70 y.o. male who presents today via EMS after syncopal episode at home.  Patient endorses 2 days of bright red blood on the toilet paper, and then 1 day today of multiple episodes of watery diarrhea with bright red blood throughout, large amounts of bright red blood in the toilet.  Patient endorses using "very strong fat blunt" today of marijuana, as well as taking a shot of brandy.  He subsequently had a large bloody bowel movement at home, and then sat in his recliner and lost consciousness.  He was reportedly unconscious for several minutes, and a friend who was in the home called EMS.  Vital signs were normal with EMS  Patient had another syncopal episode in the waiting room here [facility], while in the bathroom trying to urinate.  He has not had any further episodes of bloody diarrhea in the emergency department.  He denies any chest pain, shortness of breath, but does endorse intermittent "uncomfortable feeling" in his left chest and feeling that his heart is not beating correctly.  He denies any abdominal pain, nausea, vomiting, endorses mild headaches but denies blurry vision or double vision.  Patient has never syncopized prior to this.  I personally reviewed this patient's medical record.  History of hypertension, diabetes mellitus. No history of arrhythmia.  HPI     Past Medical History:  Diagnosis Date  . Arthritis   . Diabetes mellitus without complication (HCC)    Diet controlled  . GERD (gastroesophageal reflux disease)   . Headache   . History of kidney stones   . Hypertension   . Neuromuscular disorder (HCC)    Finger tips numbness/tingling    Patient Active Problem List   Diagnosis Date Noted  . Syncope and collapse 11/23/2020  .  Osteoarthritis of right knee 12/30/2017    Past Surgical History:  Procedure Laterality Date  . FRACTURE SURGERY  1972   Wrist   . KIDNEY STONE SURGERY  10/1997  . KNEE ARTHROPLASTY Right 12/30/2017   Procedure: RIGHT TOTAL KNEE ARTHROPLASTY;  Surgeon: Samson Frederic, MD;  Location: [facility]S;  Service: Orthopedics;  Laterality: Right;  Needs RNFA       No family history on file.  Social History   Tobacco Use  . Smoking status: Former Smoker    Packs/day: 0.50    Years: 25.00    Pack years: 12.50    Types: Cigarettes    Quit date: 12/28/2016    Years since quitting: 3.9  . Smokeless tobacco: Never Used  Vaping Use  . Vaping Use: Never used  Substance Use Topics  . Alcohol use: Yes    Alcohol/week: 2.0 - 3.0 standard drinks    Types: 1 - 2 Cans of beer, 1 Shots of liquor per week    Comment: Weekly  . 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 Authorizing Provider  Ascorbic Acid (VITAMIN C) 1000 MG tablet Take 1,000 mg by mouth daily.    [provider]  aspirin 81 MG chewable tablet Chew 1 tablet (81 mg total)  by mouth 2 (two) times daily. 12/31/17   Swinteck, Arlys John, MD  docusate sodium (COLACE) 100 MG capsule Take 1 capsule (100 mg total) by mouth 2 (two) times daily. 12/31/17   Swinteck, Arlys John, MD  hydrochlorothiazide (HYDRODIURIL) 25 MG tablet Take 25 mg by mouth daily.    [provider]  loratadine (ALLERGY RELIEF) 10 MG tablet Take 10 mg by mouth daily. ALLERGY RELIEF    [provider]  methocarbamol (ROBAXIN) 500 MG tablet Take 500 mg by mouth 4 (four) times daily as needed for muscle spasms.    [provider]  ondansetron (ZOFRAN) 4 MG tablet Take 1 tablet (4 mg total) by mouth every 6 (six) hours as needed for nausea. 12/31/17   Swinteck, Arlys John, MD   oxyCODONE (OXY IR/ROXICODONE) 5 MG immediate release tablet Take 1-2 tablets (5-10 mg total) by mouth every 4 (four) hours as needed. 12/31/17   Swinteck, Arlys John, MD  oxyCODONE (OXYCONTIN) 10 mg 12 hr tablet Take 1 tablet (10 mg total) by mouth PRO. 1 tab PO every 12 hours for 3 days, then 1 tab PO daily for 4 days 12/31/17   Samson Frederic, MD  pyridOXINE (VITAMIN B-6) 100 MG tablet Take 200 mg by mouth daily.    [provider]  senna (SENOKOT) 8.6 MG TABS tablet Take 2 tablets (17.2 mg total) by mouth at bedtime. 12/31/17   Swinteck, Arlys John, MD  Trolamine Salicylate (ASPERCREME) 10 % LOTN Apply 1 application topically 2 (two) times daily as needed (pain in hands).    [provider]    Allergies    Novocain [procaine] and Penicillins  Review of Systems   Review of Systems  Constitutional: Negative.   HENT: Negative.   Respiratory: Negative.   Cardiovascular: Negative.   Gastrointestinal: Positive for anal bleeding, blood in stool, constipation, diarrhea and nausea. Negative for vomiting.  Genitourinary: Negative.   Musculoskeletal: Negative.   Skin: Negative.   Neurological: Positive for syncope. Negative for weakness and light-headedness.  Psychiatric/Behavioral: Negative.     Physical Exam Updated Vital Signs BP 130/73   Pulse 89   Temp 98.2 F (36.8 C) (Oral)   Resp 17   SpO2 98%   Physical Exam Vitals and nursing note reviewed. Exam conducted with a chaperone present.  Constitutional:      Appearance: He is obese.  HENT:     Head: Normocephalic and atraumatic.     Nose: Nose normal.     Mouth/Throat:     Mouth: Mucous membranes are dry.     Pharynx: Oropharynx is clear. Uvula midline. No oropharyngeal exudate or posterior oropharyngeal erythema.     Tonsils: No tonsillar exudate.  Eyes:     General: Lids are normal. Vision grossly intact.        Right eye: No discharge.        Left eye: No discharge.     Extraocular Movements: Extraocular movements  intact.     Conjunctiva/sclera: Conjunctivae normal.     Pupils: Pupils are equal, round, and reactive to light.  Neck:     Trachea: Trachea and phonation normal.  Cardiovascular:     Rate and Rhythm: Normal rate. Rhythm irregular.     Pulses: Normal pulses.          Radial pulses are 2+ on the right side and 2+ on the left side.       Dorsalis pedis pulses are 2+ on the right side and 2+ on the left side.  Heart sounds: Normal heart sounds. No murmur heard.     Comments: On auscultation there are pauses in the patient's heart rhythm.  He will have anywhere from 3-6 beats and then will have a pause ranging from 1 to 3 seconds in length prior to resuming regular beats once again.  This occurred consistently throughout the 60 seconds that by auscultated this patient's heart. Pulmonary:     Effort: Pulmonary effort is normal. No respiratory distress.     Breath sounds: Normal breath sounds. No wheezing or rales.  Chest:     Chest wall: No deformity, swelling, tenderness, crepitus or edema.  Abdominal:     General: Bowel sounds are normal. There is no distension.     Palpations: Abdomen is soft.     Tenderness: There is no abdominal tenderness. There is no right CVA tenderness, left CVA tenderness, guarding or rebound.  Genitourinary:    Rectum: Guaiac result positive. Tenderness, external hemorrhoid and internal hemorrhoid present.     Comments: Fecal occult blood + Musculoskeletal:        General: No deformity.     Cervical back: Full passive range of motion without pain, normal range of motion and neck supple. No rigidity or crepitus. No pain with movement, spinous process tenderness or muscular tenderness.     Right lower leg: No edema.     Left lower leg: No edema.  Lymphadenopathy:     Cervical: No cervical adenopathy.  Skin:    General: Skin is warm and dry.     Capillary Refill: Capillary refill takes less than 2 seconds.  Neurological:     General: No focal deficit  present.     Mental Status: He is alert and oriented to person, place, and time. Mental status is at baseline.     Cranial Nerves: Cranial nerves are intact.     Sensory: Sensation is intact.     Motor: Motor function is intact.     Gait: Gait is intact.  Psychiatric:        Mood and Affect: Mood normal.    ED Results / Procedures / Treatments   Labs (all labs ordered are listed, but only abnormal results are displayed) Labs Reviewed  BASIC METABOLIC PANEL - Abnormal; Notable for the following components:      Result Value   Glucose, Bld 225 (*)    Calcium 8.8 (*)    All other components within normal limits  CBC - Abnormal; Notable for the following components:   WBC 11.0 (*)    Hemoglobin 11.4 (*)    HCT 35.3 (*)    RDW 15.7 (*)    All other components within normal limits  HEPATIC FUNCTION PANEL - Abnormal; Notable for the following components:   Total Protein 6.1 (*)    All other components within normal limits  CBG MONITORING, ED - Abnormal; Notable for the following components:   Glucose-Capillary 189 (*)    All other components within normal limits  POC OCCULT BLOOD, ED - Abnormal; Notable for the following components:   Fecal Occult Bld POSITIVE (*)    All other components within normal limits  CBG MONITORING, ED - Abnormal; Notable for the following components:   Glucose-Capillary 214 (*)    All other components within normal limits  TROPONIN I (HIGH SENSITIVITY) - Abnormal; Notable for the following components:   Troponin I (High Sensitivity) 58 (*)    All other components within normal limits  TROPONIN I (HIGH SENSITIVITY) -  Abnormal; Notable for the following components:   Troponin I (High Sensitivity) 52 (*)    All other components within normal limits  URINALYSIS, ROUTINE W REFLEX MICROSCOPIC  RAPID URINE DRUG SCREEN, HOSP PERFORMED  HIV ANTIBODY (ROUTINE TESTING W REFLEX)  MAGNESIUM  CBC WITH DIFFERENTIAL/PLATELET  TSH  HEMOGLOBIN A1C  COMPREHENSIVE  METABOLIC PANEL  LIPID PANEL  POC SARS CORONAVIRUS 2 AG -  ED    EKG EKG Interpretation  Date/Time:  Saturday November 23 2020 16:27:12 EST Ventricular Rate:  91 PR Interval:    QRS Duration: 92 QT Interval:  358 QTC Calculation: 441 R Axis:   48 Text Interpretation: Sinus rhythm Multiple premature complexes, vent & supraven Sinus pause since last tracing no significant change Confirmed by Eber Hong ([id number]) on 11/23/2020 4:32:24 PM   Radiology CT Head Wo Contrast  Result Date: 11/23/2020 CLINICAL DATA:  Syncope, simple, abnormal neuro exam EXAM: CT HEAD WITHOUT CONTRAST TECHNIQUE: Contiguous axial images were obtained from the base of the skull through the vertex without intravenous contrast. COMPARISON:  None. FINDINGS: Brain: Brain volume is normal for age. No intracranial hemorrhage, mass effect, or midline shift. No hydrocephalus. The basilar cisterns are patent. There is moderate periventricular and deep white matter hypodensity. No evidence of territorial infarct or acute ischemia. No extra-axial or intracranial fluid collection. Vascular: No hyperdense vessel. Skull: No fracture or focal lesion. Sinuses/Orbits: Scattered mucosal thickening throughout the ethmoid air cells and maxillary sinuses. No sinus fluid level. Orbits are unremarkable. Mastoid air cells are clear. Other: None. IMPRESSION: 1. No acute intracranial abnormality. 2. Moderate periventricular and deep white matter hypodensity, nonspecific but typically chronic small vessel ischemia. Electronically Signed   By: Narda Rutherford M.D.   On: 11/23/2020 17:54    Procedures Procedures   Medications Ordered in ED Medications  sodium chloride flush (NS) 0.9 % injection 3 mL (has no administration in time range)  enoxaparin (LOVENOX) injection 40 mg (40 mg Subcutaneous Given 11/23/20 1934)  acetaminophen (TYLENOL) tablet 650 mg (has no administration in time range)    Or  acetaminophen (TYLENOL) suppository 650 mg (has  no administration in time range)  senna-docusate (Senokot-S) tablet 1 tablet (has no administration in time range)  aspirin chewable tablet 81 mg (has no administration in time range)  insulin aspart (novoLOG) injection 0-9 Units (has no administration in time range)    ED Course  I have reviewed the triage vital signs and the nursing notes.  Pertinent labs & imaging results that were available during my care of the patient were reviewed by me and considered in my medical decision making (see chart for details).  Clinical Course as of 11/24/20 0009  Sat Nov 23, 2020  1558 Consult placed to cardiology, Dr. Cherly Beach who recommends telemetry bed.  He is agreeable to seeing this patient in the emergency department and following him on the inpatient side.  He has no immediate recommendations for intervention at this time. I appreciate his collaboration care of this patient. [RS]  1745 Consult placed to internal medicine physician, Dr. Cleaster Corin, who was agreeable to seeing this patient and admitting him to her service.  I appreciate her collaboration care of this patient.  She is requesting that we contact cardiology for recommendations. [RS]    Clinical Course User Index [RS] Zula Hovsepian, Idelia Salm   MDM Rules/Calculators/A&P                         70 year old male  who presents with concern for 2 episodes of syncope today as well as multiple watery bloody bowel movements today.  Patient mildly tachycardic on intake to 109 bpm. Vital signs otherwise normal. At time of my exam patient is longer tachycardic, however on auscultation of his heart there are significant pauses in his heart rhythm. Pulmonary exam is normal, abdominal exam is benign. Rectal exam was fecal occult positive, there is bright red blood on this provider's glove. Additionally there are palpable internal hemorrhoids, and skin tags from healed external hemorrhoids.  Laboratory studies obtained in triage, CBC with mild  leukocytosis of 11, hemoglobin mildly low at 11.4, however improved from prior reading of 10.5. BMP unremarkable. Hepatic function panel unremarkable. EKG concerning for sinus pause, CT of the head negative for acute intracranial abnormality. Given abnormal cardiac exam and EKG, will proceed with troponin at this time. Given cardiac abnormalities in context of multiple syncopal episodes today, feels patient would benefit from admission. Please see ED course above. Given stable hemoglobin and physical exam with internal hemorrhoids, no indication for GI consult at this time.  Mr. Spiker voiced understanding of his medical evaluation and treatment plan. Each of his questions was answered to his expressed satisfction. He is amenable to plan for admission at this time.  This chart was dictated using voice recognition software, Dragon. Despite the best efforts of this provider to proofread and correct errors, errors may still occur which can change documentation meaning.  Final Clinical Impression(s) / ED Diagnoses Final diagnoses:  Cardiac arrhythmia, unspecified cardiac arrhythmia type    Rx / DC Orders ED Discharge Orders    None       Sherrilee Gilles 11/24/20 0010    Eber Hong, MD 11/24/20 1450

## 2020-11-23 NOTE — ED Notes (Signed)
Delta, Piqu[place] notified of POS (+) fecal occult

## 2020-11-23 NOTE — ED Provider Notes (Incomplete)
[facility] EMERGENCY DEPARTMENT Provider Note   CSN: [id number] Arrival date & time: 11/23/20  1357     History Chief Complaint  Patient presents with  . Loss of Consciousness    Jon Robinson. is a 70 y.o. male who presents today via EMS after syncopal episode at home.  Patient endorses 2 days of bright red blood on the toilet paper, and then 1 day today of multiple episodes of watery diarrhea with bright red blood throughout, large amounts of bright red blood in the toilet.  Patient endorses using "very strong fat blunt" today of marijuana, as well as taking a shot of brandy.  He subsequently had a large bloody bowel movement at home, and then sat in his recliner and lost consciousness.  He was reportedly unconscious for several minutes, and a friend who was in the home called EMS.  Vital signs were normal with EMS  Patient had another syncopal episode in the waiting room here [facility], while in the bathroom trying to urinate.  He has not had any further episodes of bloody diarrhea in the emergency department.  He denies any chest pain, shortness of breath, but does endorse intermittent "uncomfortable feeling" in his left chest and feeling that his heart is not beating correctly.  He denies any abdominal pain, nausea, vomiting, endorses mild headaches but denies blurry vision or double vision.  Patient has never syncopized prior to this.  I personally reviewed this patient's medical record.  History of hypertension, diabetes mellitus. No history of arrhythmia.  HPI     Past Medical History:  Diagnosis Date  . Arthritis   . Diabetes mellitus without complication ([place])    Diet controlled  . GERD (gastroesophageal reflux disease)   . Headache   . History of kidney stones   . Hypertension   . Neuromuscular disorder ([place])    Finger tips numbness/tingling    Patient Active Problem List   Diagnosis Date Noted  . Syncope and collapse 11/23/2020  .  Osteoarthritis of right knee 12/30/2017    Past Surgical History:  Procedure Laterality Date  . FRACTURE SURGERY  1972   Wrist   . KIDNEY STONE SURGERY  10/1997  . KNEE ARTHROPLASTY Right 12/30/2017   Procedure: RIGHT TOTAL KNEE ARTHROPLASTY;  Surgeon: Rod Can, MD;  Location: [facility];  Service: Orthopedics;  Laterality: Right;  Needs RNFA       No family history on file.  Social History   Tobacco Use  . Smoking status: Former Smoker    Packs/day: 0.50    Years: 25.00    Pack years: 12.50    Types: Cigarettes    Quit date: 12/28/2016    Years since quitting: 3.9  . Smokeless tobacco: Never Used  Vaping Use  . Vaping Use: Never used  Substance Use Topics  . Alcohol use: Yes    Alcohol/week: 2.0 - 3.0 standard drinks    Types: 1 - 2 Cans of beer, 1 Shots of liquor per week    Comment: Weekly  . 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 Authorizing Provider  Ascorbic Acid (VITAMIN C) 1000 MG tablet Take 1,000 mg by mouth daily.    [provider]  aspirin 81 MG chewable tablet Chew 1 tablet (81 mg total)  by mouth 2 (two) times daily. 12/31/17   Swinteck, Aaron Edelman, MD  docusate sodium (COLACE) 100 MG capsule Take 1 capsule (100 mg total) by mouth 2 (two) times daily. 12/31/17   Swinteck, Aaron Edelman, MD  hydrochlorothiazide (HYDRODIURIL) 25 MG tablet Take 25 mg by mouth daily.    [provider]  loratadine (ALLERGY RELIEF) 10 MG tablet Take 10 mg by mouth daily. ALLERGY RELIEF    [provider]  methocarbamol (ROBAXIN) 500 MG tablet Take 500 mg by mouth 4 (four) times daily as needed for muscle spasms.    [provider]  ondansetron (ZOFRAN) 4 MG tablet Take 1 tablet (4 mg total) by mouth every 6 (six) hours as needed for nausea. 12/31/17   Swinteck, Aaron Edelman, MD   oxyCODONE (OXY IR/ROXICODONE) 5 MG immediate release tablet Take 1-2 tablets (5-10 mg total) by mouth every 4 (four) hours as needed. 12/31/17   Swinteck, Aaron Edelman, MD  oxyCODONE (OXYCONTIN) 10 mg 12 hr tablet Take 1 tablet (10 mg total) by mouth PRO. 1 tab PO every 12 hours for 3 days, then 1 tab PO daily for 4 days 12/31/17   Rod Can, MD  pyridOXINE (VITAMIN B-6) 100 MG tablet Take 200 mg by mouth daily.    [provider]  senna (SENOKOT) 8.6 MG TABS tablet Take 2 tablets (17.2 mg total) by mouth at bedtime. 12/31/17   Swinteck, Aaron Edelman, MD  Trolamine Salicylate (ASPERCREME) 10 % LOTN Apply 1 application topically 2 (two) times daily as needed (pain in hands).    [provider]    Allergies    Novocain [procaine] and Penicillins  Review of Systems   Review of Systems  Constitutional: Negative.   HENT: Negative.   Respiratory: Negative.   Cardiovascular: Negative.   Gastrointestinal: Positive for anal bleeding, blood in stool, constipation, diarrhea and nausea. Negative for vomiting.  Genitourinary: Negative.   Musculoskeletal: Negative.   Skin: Negative.   Neurological: Positive for syncope. Negative for weakness and light-headedness.  Psychiatric/Behavioral: Negative.     Physical Exam Updated Vital Signs BP 130/73   Pulse 89   Temp 98.2 F (36.8 C) (Oral)   Resp 17   SpO2 98%   Physical Exam Vitals and nursing note reviewed. Exam conducted with a chaperone present.  Constitutional:      Appearance: He is obese.  HENT:     Head: Normocephalic and atraumatic.     Nose: Nose normal.     Mouth/Throat:     Mouth: Mucous membranes are dry.     Pharynx: Oropharynx is clear. Uvula midline. No oropharyngeal exudate or posterior oropharyngeal erythema.     Tonsils: No tonsillar exudate.  Eyes:     General: Lids are normal. Vision grossly intact.        Right eye: No discharge.        Left eye: No discharge.     Extraocular Movements: Extraocular movements  intact.     Conjunctiva/sclera: Conjunctivae normal.     Pupils: Pupils are equal, round, and reactive to light.  Neck:     Trachea: Trachea and phonation normal.  Cardiovascular:     Rate and Rhythm: Normal rate. Rhythm irregular.     Pulses: Normal pulses.          Radial pulses are 2+ on the right side and 2+ on the left side.       Dorsalis pedis pulses are 2+ on the right side and 2+ on the left side.  Heart sounds: Normal heart sounds. No murmur heard.     Comments: On auscultation there are pauses in the patient's heart rhythm.  He will have anywhere from 3-6 beats and then will have a pause ranging from 1 to 3 seconds in length prior to resuming regular beats once again.  This occurred consistently throughout the 60 seconds that by auscultated this patient's heart. Pulmonary:     Effort: Pulmonary effort is normal. No respiratory distress.     Breath sounds: Normal breath sounds. No wheezing or rales.  Chest:     Chest wall: No deformity, swelling, tenderness, crepitus or edema.  Abdominal:     General: Bowel sounds are normal. There is no distension.     Palpations: Abdomen is soft.     Tenderness: There is no abdominal tenderness. There is no right CVA tenderness, left CVA tenderness, guarding or rebound.  Genitourinary:    Rectum: Guaiac result positive. Tenderness, external hemorrhoid and internal hemorrhoid present.     Comments: Fecal occult blood + Musculoskeletal:        General: No deformity.     Cervical back: Full passive range of motion without pain, normal range of motion and neck supple. No rigidity or crepitus. No pain with movement, spinous process tenderness or muscular tenderness.     Right lower leg: No edema.     Left lower leg: No edema.  Lymphadenopathy:     Cervical: No cervical adenopathy.  Skin:    General: Skin is warm and dry.     Capillary Refill: Capillary refill takes less than 2 seconds.  Neurological:     General: No focal deficit  present.     Mental Status: He is alert and oriented to person, place, and time. Mental status is at baseline.     Cranial Nerves: Cranial nerves are intact.     Sensory: Sensation is intact.     Motor: Motor function is intact.     Gait: Gait is intact.  Psychiatric:        Mood and Affect: Mood normal.    ED Results / Procedures / Treatments   Labs (all labs ordered are listed, but only abnormal results are displayed) Labs Reviewed  BASIC METABOLIC PANEL - Abnormal; Notable for the following components:      Result Value   Glucose, Bld 225 (*)    Calcium 8.8 (*)    All other components within normal limits  CBC - Abnormal; Notable for the following components:   WBC 11.0 (*)    Hemoglobin 11.4 (*)    HCT 35.3 (*)    RDW 15.7 (*)    All other components within normal limits  HEPATIC FUNCTION PANEL - Abnormal; Notable for the following components:   Total Protein 6.1 (*)    All other components within normal limits  CBG MONITORING, ED - Abnormal; Notable for the following components:   Glucose-Capillary 189 (*)    All other components within normal limits  POC OCCULT BLOOD, ED - Abnormal; Notable for the following components:   Fecal Occult Bld POSITIVE (*)    All other components within normal limits  CBG MONITORING, ED - Abnormal; Notable for the following components:   Glucose-Capillary 214 (*)    All other components within normal limits  TROPONIN I (HIGH SENSITIVITY) - Abnormal; Notable for the following components:   Troponin I (High Sensitivity) 58 (*)    All other components within normal limits  TROPONIN I (HIGH SENSITIVITY) -  Abnormal; Notable for the following components:   Troponin I (High Sensitivity) 52 (*)    All other components within normal limits  URINALYSIS, ROUTINE W REFLEX MICROSCOPIC  RAPID URINE DRUG SCREEN, HOSP PERFORMED  HIV ANTIBODY (ROUTINE TESTING W REFLEX)  MAGNESIUM  CBC WITH DIFFERENTIAL/PLATELET  TSH  HEMOGLOBIN A1C  COMPREHENSIVE  METABOLIC PANEL  LIPID PANEL  POC SARS CORONAVIRUS 2 AG -  ED    EKG EKG Interpretation  Date/Time:  Saturday November 23 2020 16:27:12 EST Ventricular Rate:  91 PR Interval:    QRS Duration: 92 QT Interval:  358 QTC Calculation: 441 R Axis:   48 Text Interpretation: Sinus rhythm Multiple premature complexes, vent & supraven Sinus pause since last tracing no significant change Confirmed by Noemi Chapel [phone]) on 11/23/2020 4:32:24 PM   Radiology CT Head Wo Contrast  Result Date: 11/23/2020 CLINICAL DATA:  Syncope, simple, abnormal neuro exam EXAM: CT HEAD WITHOUT CONTRAST TECHNIQUE: Contiguous axial images were obtained from the base of the skull through the vertex without intravenous contrast. COMPARISON:  None. FINDINGS: Brain: Brain volume is normal for age. No intracranial hemorrhage, mass effect, or midline shift. No hydrocephalus. The basilar cisterns are patent. There is moderate periventricular and deep white matter hypodensity. No evidence of territorial infarct or acute ischemia. No extra-axial or intracranial fluid collection. Vascular: No hyperdense vessel. Skull: No fracture or focal lesion. Sinuses/Orbits: Scattered mucosal thickening throughout the ethmoid air cells and maxillary sinuses. No sinus fluid level. Orbits are unremarkable. Mastoid air cells are clear. Other: None. IMPRESSION: 1. No acute intracranial abnormality. 2. Moderate periventricular and deep white matter hypodensity, nonspecific but typically chronic small vessel ischemia. Electronically Signed   By: Keith Rake M.D.   On: 11/23/2020 17:54    Procedures Procedures {Remember to document critical care time when appropriate:1}  Medications Ordered in ED Medications  sodium chloride flush (NS) 0.9 % injection 3 mL (has no administration in time range)  enoxaparin (LOVENOX) injection 40 mg (40 mg Subcutaneous Given 11/23/20 1934)  acetaminophen (TYLENOL) tablet 650 mg (has no administration in time  range)    Or  acetaminophen (TYLENOL) suppository 650 mg (has no administration in time range)  senna-docusate (Senokot-S) tablet 1 tablet (has no administration in time range)  aspirin chewable tablet 81 mg (has no administration in time range)  insulin aspart (novoLOG) injection 0-9 Units (has no administration in time range)    ED Course  I have reviewed the triage vital signs and the nursing notes.  Pertinent labs & imaging results that were available during my care of the patient were reviewed by me and considered in my medical decision making (see chart for details).  Clinical Course as of 11/24/20 0000  Sat Nov 23, 2020  1558 Consult placed to cardiology, Dr. Renella Cunas who recommends telemetry bed.  He is agreeable to seeing this patient in the emergency department and following him on the inpatient side.  He has no immediate recommendations for intervention at this time. I appreciate his collaboration care of this patient. [RS]  3500 Consult placed to internal medicine physician, Dr. Sharon Seller, who was agreeable to seeing this patient and admitting him to her service.  I appreciate her collaboration care of this patient.  She is requesting that we contact cardiology for recommendations. [RS]    Clinical Course User Index [RS] Indiana Pechacek, Gypsy Balsam, PA-C   MDM Rules/Calculators/A&P                          ***  Final Clinical Impression(s) / ED Diagnoses Final diagnoses:  Cardiac arrhythmia, unspecified cardiac arrhythmia type    Rx / DC Orders ED Discharge Orders    None

## 2020-11-24 ENCOUNTER — Observation Stay (HOSPITAL_COMMUNITY): Payer: No Typology Code available for payment source

## 2020-11-24 DIAGNOSIS — K625 Hemorrhage of anus and rectum: Secondary | ICD-10-CM | POA: Diagnosis not present

## 2020-11-24 DIAGNOSIS — K649 Unspecified hemorrhoids: Secondary | ICD-10-CM | POA: Diagnosis not present

## 2020-11-24 DIAGNOSIS — K219 Gastro-esophageal reflux disease without esophagitis: Secondary | ICD-10-CM | POA: Diagnosis present

## 2020-11-24 DIAGNOSIS — F419 Anxiety disorder, unspecified: Secondary | ICD-10-CM | POA: Diagnosis present

## 2020-11-24 DIAGNOSIS — N179 Acute kidney failure, unspecified: Secondary | ICD-10-CM | POA: Diagnosis present

## 2020-11-24 DIAGNOSIS — R55 Syncope and collapse: Secondary | ICD-10-CM

## 2020-11-24 DIAGNOSIS — I499 Cardiac arrhythmia, unspecified: Secondary | ICD-10-CM | POA: Insufficient documentation

## 2020-11-24 DIAGNOSIS — K5731 Diverticulosis of large intestine without perforation or abscess with bleeding: Secondary | ICD-10-CM | POA: Diagnosis present

## 2020-11-24 DIAGNOSIS — K5791 Diverticulosis of intestine, part unspecified, without perforation or abscess with bleeding: Secondary | ICD-10-CM

## 2020-11-24 DIAGNOSIS — E119 Type 2 diabetes mellitus without complications: Secondary | ICD-10-CM | POA: Diagnosis present

## 2020-11-24 DIAGNOSIS — K635 Polyp of colon: Secondary | ICD-10-CM | POA: Diagnosis not present

## 2020-11-24 DIAGNOSIS — Z880 Allergy status to penicillin: Secondary | ICD-10-CM | POA: Diagnosis not present

## 2020-11-24 DIAGNOSIS — E876 Hypokalemia: Secondary | ICD-10-CM | POA: Diagnosis present

## 2020-11-24 DIAGNOSIS — F1290 Cannabis use, unspecified, uncomplicated: Secondary | ICD-10-CM | POA: Diagnosis present

## 2020-11-24 DIAGNOSIS — Z20822 Contact with and (suspected) exposure to covid-19: Secondary | ICD-10-CM | POA: Diagnosis present

## 2020-11-24 DIAGNOSIS — Z8249 Family history of ischemic heart disease and other diseases of the circulatory system: Secondary | ICD-10-CM | POA: Diagnosis not present

## 2020-11-24 DIAGNOSIS — K921 Melena: Secondary | ICD-10-CM | POA: Diagnosis not present

## 2020-11-24 DIAGNOSIS — Z87442 Personal history of urinary calculi: Secondary | ICD-10-CM | POA: Diagnosis not present

## 2020-11-24 DIAGNOSIS — K5900 Constipation, unspecified: Secondary | ICD-10-CM | POA: Diagnosis present

## 2020-11-24 DIAGNOSIS — Z79899 Other long term (current) drug therapy: Secondary | ICD-10-CM | POA: Diagnosis not present

## 2020-11-24 DIAGNOSIS — K644 Residual hemorrhoidal skin tags: Secondary | ICD-10-CM | POA: Diagnosis present

## 2020-11-24 DIAGNOSIS — K641 Second degree hemorrhoids: Secondary | ICD-10-CM | POA: Diagnosis present

## 2020-11-24 DIAGNOSIS — E861 Hypovolemia: Secondary | ICD-10-CM | POA: Diagnosis present

## 2020-11-24 DIAGNOSIS — I77819 Aortic ectasia, unspecified site: Secondary | ICD-10-CM | POA: Diagnosis present

## 2020-11-24 DIAGNOSIS — I493 Ventricular premature depolarization: Secondary | ICD-10-CM | POA: Diagnosis present

## 2020-11-24 DIAGNOSIS — R040 Epistaxis: Secondary | ICD-10-CM | POA: Diagnosis present

## 2020-11-24 DIAGNOSIS — E871 Hypo-osmolality and hyponatremia: Secondary | ICD-10-CM | POA: Diagnosis present

## 2020-11-24 DIAGNOSIS — I10 Essential (primary) hypertension: Secondary | ICD-10-CM | POA: Diagnosis present

## 2020-11-24 DIAGNOSIS — Z7982 Long term (current) use of aspirin: Secondary | ICD-10-CM | POA: Diagnosis not present

## 2020-11-24 DIAGNOSIS — D62 Acute posthemorrhagic anemia: Secondary | ICD-10-CM | POA: Diagnosis present

## 2020-11-24 LAB — LIPID PANEL
Cholesterol: 142 mg/dL (ref 0–200)
HDL: 39 mg/dL — ABNORMAL LOW (ref >40)
LDL Cholesterol: 76 mg/dL (ref 0–99)
Total CHOL/HDL Ratio: 3.6 RATIO
Triglycerides: 137 mg/dL (ref <150)
VLDL: 27 mg/dL (ref 0–40)

## 2020-11-24 LAB — RAPID URINE DRUG SCREEN, HOSP PERFORMED
Amphetamines: NOT DETECTED
Barbiturates: NOT DETECTED
Benzodiazepines: NOT DETECTED
Cocaine: NOT DETECTED
Opiates: NOT DETECTED
Tetrahydrocannabinol: POSITIVE — AB

## 2020-11-24 LAB — CBC
HCT: 33.9 % — ABNORMAL LOW (ref 39.0–52.0)
HCT: 31.6 % — ABNORMAL LOW (ref 39.0–52.0)
Hemoglobin: 10.6 g/dL — ABNORMAL LOW (ref 13.0–17.0)
Hemoglobin: 10.4 g/dL — ABNORMAL LOW (ref 13.0–17.0)
MCH: 26.1 pg (ref 26.0–34.0)
MCH: 26.5 pg (ref 26.0–34.0)
MCHC: 31.3 g/dL (ref 30.0–36.0)
MCHC: 32.9 g/dL (ref 30.0–36.0)
MCV: 83.5 fL (ref 80.0–100.0)
MCV: 80.4 fL (ref 80.0–100.0)
Platelets: 244 10*3/uL (ref 150–400)
Platelets: 250 10*3/uL (ref 150–400)
RBC: 4.06 MIL/uL — ABNORMAL LOW (ref 4.22–5.81)
RBC: 3.93 MIL/uL — ABNORMAL LOW (ref 4.22–5.81)
RDW: 15.6 % — ABNORMAL HIGH (ref 11.5–15.5)
RDW: 15.5 % (ref 11.5–15.5)
WBC: 10.4 10*3/uL (ref 4.0–10.5)
WBC: 10 10*3/uL (ref 4.0–10.5)
nRBC: 0 % (ref 0.0–0.2)
nRBC: 0 % (ref 0.0–0.2)

## 2020-11-24 LAB — ECHOCARDIOGRAM COMPLETE
Area-P 1/2: 3.17 cm2
S' Lateral: 3 cm

## 2020-11-24 LAB — COMPREHENSIVE METABOLIC PANEL
ALT: 18 U/L (ref 0–44)
AST: 18 U/L (ref 15–41)
Albumin: 3.3 g/dL — ABNORMAL LOW (ref 3.5–5.0)
Alkaline Phosphatase: 72 U/L (ref 38–126)
Anion gap: 8 (ref 5–15)
BUN: 14 mg/dL (ref 8–23)
CO2: 26 mmol/L (ref 22–32)
Calcium: 9.2 mg/dL (ref 8.9–10.3)
Chloride: 104 mmol/L (ref 98–111)
Creatinine, Ser: 0.97 mg/dL (ref 0.61–1.24)
GFR, Estimated: >60 mL/min (ref >60)
Glucose, Bld: 163 mg/dL — ABNORMAL HIGH (ref 70–99)
Potassium: 3.8 mmol/L (ref 3.5–5.1)
Sodium: 138 mmol/L (ref 135–145)
Total Bilirubin: 0.4 mg/dL (ref 0.3–1.2)
Total Protein: 5.9 g/dL — ABNORMAL LOW (ref 6.5–8.1)

## 2020-11-24 LAB — URINALYSIS, ROUTINE W REFLEX MICROSCOPIC
Bilirubin Urine: NEGATIVE
Glucose, UA: NEGATIVE mg/dL
Hgb urine dipstick: NEGATIVE
Ketones, ur: NEGATIVE mg/dL
Leukocytes,Ua: NEGATIVE
Nitrite: NEGATIVE
Protein, ur: NEGATIVE mg/dL
Specific Gravity, Urine: 1.016 (ref 1.005–1.030)
pH: 5 (ref 5.0–8.0)

## 2020-11-24 LAB — HEMOGLOBIN A1C
Hgb A1c MFr Bld: 7.9 % — ABNORMAL HIGH (ref 4.8–5.6)
Mean Plasma Glucose: 180.03 mg/dL

## 2020-11-24 LAB — CBG MONITORING, ED
Glucose-Capillary: 127 mg/dL — ABNORMAL HIGH (ref 70–99)
Glucose-Capillary: 133 mg/dL — ABNORMAL HIGH (ref 70–99)
Glucose-Capillary: 140 mg/dL — ABNORMAL HIGH (ref 70–99)

## 2020-11-24 LAB — GLUCOSE, CAPILLARY
Glucose-Capillary: 141 mg/dL — ABNORMAL HIGH (ref 70–99)
Glucose-Capillary: 151 mg/dL — ABNORMAL HIGH (ref 70–99)

## 2020-11-24 LAB — IRON AND TIBC
Iron: 106 ug/dL (ref 45–182)
Saturation Ratios: 38 % (ref 17.9–39.5)
TIBC: 277 ug/dL (ref 250–450)
UIBC: 171 ug/dL

## 2020-11-24 LAB — MAGNESIUM: Magnesium: 2.2 mg/dL (ref 1.7–2.4)

## 2020-11-24 LAB — FERRITIN: Ferritin: 77 ng/mL (ref 24–336)

## 2020-11-24 LAB — TSH: TSH: 0.923 u[IU]/mL (ref 0.350–4.500)

## 2020-11-24 LAB — HIV ANTIBODY (ROUTINE TESTING W REFLEX): HIV Screen 4th Generation wRfx: NONREACTIVE

## 2020-11-24 MED ORDER — SODIUM CHLORIDE 0.9 % IV SOLN: INTRAVENOUS | Status: DC

## 2020-11-24 MED ORDER — ONDANSETRON HCL 4 MG/2ML IJ SOLN
4.0000 mg | Freq: Three times a day (TID) | INTRAMUSCULAR | Status: DC | PRN
Start: 2020-11-24 — End: 2020-11-28

## 2020-11-24 MED ORDER — HYDROCHLOROTHIAZIDE 25 MG PO TABS
25.0000 mg | ORAL_TABLET | Freq: Every day | ORAL | Status: DC
  Administered 2020-11-24 – 2020-11-28 (×5): 25 mg via ORAL
  Filled 2020-11-24 (×5): qty 1

## 2020-11-24 MED ORDER — PEG 3350-KCL-NA BICARB-NACL 420 G PO SOLR
4000.0000 mL | Freq: Once | ORAL | Status: AC
  Administered 2020-11-24: 4000 mL via ORAL
  Filled 2020-11-24: qty 4000

## 2020-11-24 NOTE — H&P (View-Only) (Signed)
Reason for Consult: Hematochezia Referring Physician: Teaching Service  [place]. HPI: This is a 70 year old male with a PMH of GERD, HTN, DM, and arthritis admitted for complaints of syncope and hematochezia.  Cardiology was consulted and they felt that the syncope was a vasovagal process.  His cardiac work up was negative.  He reports having some problems with hematochezia over the past several days.  Initially he noticed that the blood was on the toilet tissue, but this past Saturday AM at 4-5 AM he had significant bleeding.  He reports that "it was shooting out everywhere".  His HGB appears to be consistent with his prior values from 2019, but he is anemic at 10.6 g/dL.  He receives his care from the [place] and he reports having a normal routine colonoscopy 3-4 years ago.  Past Medical History:  Diagnosis Date  . Arthritis   . Diabetes mellitus without complication ([place])    Diet controlled  . GERD (gastroesophageal reflux disease)   . Headache   . History of kidney stones   . Hypertension   . Neuromuscular disorder ([place])    Finger tips numbness/tingling    Past Surgical History:  Procedure Laterality Date  . FRACTURE SURGERY  1972   Wrist   . KIDNEY STONE SURGERY  10/1997  . KNEE ARTHROPLASTY Right 12/30/2017   Procedure: RIGHT TOTAL KNEE ARTHROPLASTY;  Surgeon: Rod Can, MD;  Location: [facility];  Service: Orthopedics;  Laterality: Right;  Needs RNFA    No family history on file.  Social History:  reports that he quit smoking about 3 years ago. His smoking use included cigarettes. He has a 12.50 pack-year smoking history. He has never used smokeless tobacco. He reports current alcohol use of about 2.0 - 3.0 standard drinks of alcohol per week. He reports current drug use. Drugs: Heroin, "Crack" cocaine, and Marijuana.  Allergies:  Allergies  Allergen Reactions  . Novocain [Procaine] Swelling    In the face  . Penicillins Other (See Comments)    Childhood reaction     Medications:  Scheduled: . hydrochlorothiazide  25 mg Oral Daily  . insulin aspart  0-9 Units Subcutaneous TID WC  . sodium chloride flush  3 mL Intravenous Q12H   Continuous:   Results for orders placed or performed during the hospital encounter of 11/23/20 (from the past 24 hour(s))  CBG monitoring, ED     Status: Abnormal   Collection Time: 11/23/20  4:24 PM  Result Value Ref Range   Glucose-Capillary 189 (H) 70 - 99 mg/dL   Comment 1 Notify RN    Comment 2 Document in Chart   Hepatic function panel     Status: Abnormal   Collection Time: 11/23/20  4:46 PM  Result Value Ref Range   Total Protein 6.1 (L) 6.5 - 8.1 g/dL   Albumin 3.5 3.5 - 5.0 g/dL   AST 19 15 - 41 U/L   ALT 17 0 - 44 U/L   Alkaline Phosphatase 75 38 - 126 U/L   Total Bilirubin 0.6 0.3 - 1.2 mg/dL   Bilirubin, Direct <0.1 0.0 - 0.2 mg/dL   Indirect Bilirubin NOT CALCULATED 0.3 - 0.9 mg/dL  Troponin I (High Sensitivity)     Status: Abnormal   Collection Time: 11/23/20  5:20 PM  Result Value Ref Range   Troponin I (High Sensitivity) 58 (H) <18 ng/L  POC SARS Coronavirus 2 Ag-ED - Nasal Swab     Status: None   Collection  Time: 11/23/20  6:04 PM  Result Value Ref Range   SARS Coronavirus 2 Ag NEGATIVE NEGATIVE  POC occult blood, ED     Status: Abnormal   Collection Time: 11/23/20  7:15 PM  Result Value Ref Range   Fecal Occult Bld POSITIVE (A) NEGATIVE  Troponin I (High Sensitivity)     Status: Abnormal   Collection Time: 11/23/20  7:33 PM  Result Value Ref Range   Troponin I (High Sensitivity) 52 (H) <18 ng/L  CBG monitoring, ED     Status: Abnormal   Collection Time: 11/23/20 10:18 PM  Result Value Ref Range   Glucose-Capillary 214 (H) 70 - 99 mg/dL  Urinalysis, Routine w reflex microscopic Urine, Clean Catch     Status: None   Collection Time: 11/23/20 11:59 PM  Result Value Ref Range   Color, Urine YELLOW YELLOW   APPearance CLEAR CLEAR   Specific Gravity, Urine 1.016 1.005 - 1.030   pH 5.0  5.0 - 8.0   Glucose, UA NEGATIVE NEGATIVE mg/dL   Hgb urine dipstick NEGATIVE NEGATIVE   Bilirubin Urine NEGATIVE NEGATIVE   Ketones, ur NEGATIVE NEGATIVE mg/dL   Protein, ur NEGATIVE NEGATIVE mg/dL   Nitrite NEGATIVE NEGATIVE   Leukocytes,Ua NEGATIVE NEGATIVE  HIV Antibody (routine testing w rflx)     Status: None   Collection Time: 11/24/20  5:11 AM  Result Value Ref Range   HIV Screen 4th Generation wRfx Non Reactive Non Reactive  Magnesium     Status: None   Collection Time: 11/24/20  5:11 AM  Result Value Ref Range   Magnesium 2.2 1.7 - 2.4 mg/dL  TSH     Status: None   Collection Time: 11/24/20  5:11 AM  Result Value Ref Range   TSH 0.923 0.350 - 4.500 uIU/mL  Comprehensive metabolic panel     Status: Abnormal   Collection Time: 11/24/20  5:11 AM  Result Value Ref Range   Sodium 138 135 - 145 mmol/L   Potassium 3.8 3.5 - 5.1 mmol/L   Chloride 104 98 - 111 mmol/L   CO2 26 22 - 32 mmol/L   Glucose, Bld 163 (H) 70 - 99 mg/dL   BUN 14 8 - 23 mg/dL   Creatinine, Ser 0.97 0.61 - 1.24 mg/dL   Calcium 9.2 8.9 - 10.3 mg/dL   Total Protein 5.9 (L) 6.5 - 8.1 g/dL   Albumin 3.3 (L) 3.5 - 5.0 g/dL   AST 18 15 - 41 U/L   ALT 18 0 - 44 U/L   Alkaline Phosphatase 72 38 - 126 U/L   Total Bilirubin 0.4 0.3 - 1.2 mg/dL   GFR, Estimated >60 >60 mL/min   Anion gap 8 5 - 15  Lipid panel     Status: Abnormal   Collection Time: 11/24/20  5:11 AM  Result Value Ref Range   Cholesterol 142 0 - 200 mg/dL   Triglycerides 137 <150 mg/dL   HDL 39 (L) >40 mg/dL   Total CHOL/HDL Ratio 3.6 RATIO   VLDL 27 0 - 40 mg/dL   LDL Cholesterol 76 0 - 99 mg/dL  CBG monitoring, ED     Status: Abnormal   Collection Time: 11/24/20  5:35 AM  Result Value Ref Range   Glucose-Capillary 127 (H) 70 - 99 mg/dL  CBC     Status: Abnormal   Collection Time: 11/24/20  6:14 AM  Result Value Ref Range   WBC 10.4 4.0 - 10.5 K/uL   RBC 4.06 (L)  4.22 - 5.81 MIL/uL   Hemoglobin 10.6 (L) 13.0 - 17.0 g/dL   HCT 33.9  (L) 39.0 - 52.0 %   MCV 83.5 80.0 - 100.0 fL   MCH 26.1 26.0 - 34.0 pg   MCHC 31.3 30.0 - 36.0 g/dL   RDW 15.6 (H) 11.5 - 15.5 %   Platelets 244 150 - 400 K/uL   nRBC 0.0 0.0 - 0.2 %  Hemoglobin A1c     Status: Abnormal   Collection Time: 11/24/20  6:14 AM  Result Value Ref Range   Hgb A1c MFr Bld 7.9 (H) 4.8 - 5.6 %   Mean Plasma Glucose 180.03 mg/dL  CBG monitoring, ED     Status: Abnormal   Collection Time: 11/24/20  8:13 AM  Result Value Ref Range   Glucose-Capillary 133 (H) 70 - 99 mg/dL  CBG monitoring, ED     Status: Abnormal   Collection Time: 11/24/20 12:14 PM  Result Value Ref Range   Glucose-Capillary 140 (H) 70 - 99 mg/dL     EEG  Result Date: 11/24/2020 Lora Havens, MD     11/24/2020 12:24 PM Patient Name: Jon Robinson. MRN: [id number] Epilepsy Attending: Lora Havens Referring Physician/Provider: Dr Hayden Rasmussen Date: 11/24/2020 Duration: 27.24 mins Patient history: 57y M with syncope. EEG to evaluate for seizure Level of alertness: Awake, asleep AEDs during EEG study: None Technical aspects: This EEG study was done with scalp electrodes positioned according to the 10-20 International system of electrode placement. Electrical activity was acquired at a sampling rate of 500Hz  and reviewed with a high frequency filter of 70Hz  and a low frequency filter of 1Hz . EEG data were recorded continuously and digitally stored. Description: The posterior dominant rhythm consists of 9-10 Hz activity of moderate voltage (25-35 uV) seen predominantly in posterior head regions, symmetric and reactive to eye opening and eye closing. Sleep was characterized by vertex waves, sleep spindles (12 to 14 Hz), maximal frontocentral region.  Hyperventilation did not show any EEG change.  Physiologic photic driving was not seen during photic stimulation. IMPRESSION: This study is within normal limits. No seizures or epileptiform discharges were seen throughout the recording. Lora Havens    CT Head Wo Contrast  Result Date: 11/23/2020 CLINICAL DATA:  Syncope, simple, abnormal neuro exam EXAM: CT HEAD WITHOUT CONTRAST TECHNIQUE: Contiguous axial images were obtained from the base of the skull through the vertex without intravenous contrast. COMPARISON:  None. FINDINGS: Brain: Brain volume is normal for age. No intracranial hemorrhage, mass effect, or midline shift. No hydrocephalus. The basilar cisterns are patent. There is moderate periventricular and deep white matter hypodensity. No evidence of territorial infarct or acute ischemia. No extra-axial or intracranial fluid collection. Vascular: No hyperdense vessel. Skull: No fracture or focal lesion. Sinuses/Orbits: Scattered mucosal thickening throughout the ethmoid air cells and maxillary sinuses. No sinus fluid level. Orbits are unremarkable. Mastoid air cells are clear. Other: None. IMPRESSION: 1. No acute intracranial abnormality. 2. Moderate periventricular and deep white matter hypodensity, nonspecific but typically chronic small vessel ischemia. Electronically Signed   By: Keith Rake M.D.   On: 11/23/2020 17:54   ECHOCARDIOGRAM COMPLETE  Result Date: 11/24/2020    ECHOCARDIOGRAM REPORT   Patient Name:   Amilcar Reever. Date of Exam: 11/24/2020 Medical Rec #:  [id number]       Height:       69.0 in Accession #:    [id number]      Weight:  210.0 lb Date of Birth:  [birth year]       BSA:          2.109 m Patient Age:    29 years        BP:           135/93 mmHg Patient Gender: M               HR:           69 bpm. Exam Location:  Inpatient Procedure: 2D Echo, Cardiac Doppler and Color Doppler Indications:    R55 Syncope  History:        Patient has no prior history of Echocardiogram examinations.                 Risk Factors:Hypertension, Diabetes and GERD.  Sonographer:    Jonelle Sidle Dance Referring Phys: [place]  1. Left ventricular ejection fraction, by estimation, is 50 to 55%. The left ventricle has low  normal function. The left ventricle has no regional wall motion abnormalities. There is mild left ventricular hypertrophy. Left ventricular diastolic parameters are consistent with Grade I diastolic dysfunction (impaired relaxation).  2. Right ventricular systolic function is normal. The right ventricular size is normal.  3. Left atrial size was moderately dilated.  4. The mitral valve is normal in structure. No evidence of mitral valve regurgitation. No evidence of mitral stenosis.  5. The aortic valve is calcified. There is mild calcification of the aortic valve. Aortic valve regurgitation is not visualized. Mild aortic valve sclerosis is present, with no evidence of aortic valve stenosis.  6. There is mild dilatation at the level of the sinuses of Valsalva, measuring 40 mm.  7. The inferior vena cava is normal in size with greater than 50% respiratory variability, suggesting right atrial pressure of 3 mmHg. FINDINGS  Left Ventricle: Left ventricular ejection fraction, by estimation, is 50 to 55%. The left ventricle has low normal function. The left ventricle has no regional wall motion abnormalities. The left ventricular internal cavity size was normal in size. There is mild left ventricular hypertrophy. Left ventricular diastolic parameters are consistent with Grade I diastolic dysfunction (impaired relaxation). Right Ventricle: The right ventricular size is normal. No increase in right ventricular wall thickness. Right ventricular systolic function is normal. Left Atrium: Left atrial size was moderately dilated. Right Atrium: Right atrial size was normal in size. Pericardium: There is no evidence of pericardial effusion. Mitral Valve: The mitral valve is normal in structure. No evidence of mitral valve regurgitation. No evidence of mitral valve stenosis. Tricuspid Valve: The tricuspid valve is normal in structure. Tricuspid valve regurgitation is not demonstrated. No evidence of tricuspid stenosis. Aortic  Valve: The aortic valve is calcified. There is mild calcification of the aortic valve. Aortic valve regurgitation is not visualized. Mild aortic valve sclerosis is present, with no evidence of aortic valve stenosis. Pulmonic Valve: The pulmonic valve was normal in structure. Pulmonic valve regurgitation is trivial. No evidence of pulmonic stenosis. Aorta: The aortic root is normal in size and structure. There is mild dilatation at the level of the sinuses of Valsalva, measuring 40 mm. Venous: The inferior vena cava is normal in size with greater than 50% respiratory variability, suggesting right atrial pressure of 3 mmHg. IAS/Shunts: No atrial level shunt detected by color flow Doppler.  LEFT VENTRICLE PLAX 2D LVIDd:         4.10 cm LVIDs:         3.00 cm LV  PW:         1.30 cm LV IVS:        1.20 cm LVOT diam:     2.30 cm LV SV:         103 LV SV Index:   49 LVOT Area:     4.15 cm  RIGHT VENTRICLE          IVC RV Basal diam:  2.70 cm  IVC diam: 2.50 cm TAPSE (M-mode): 2.3 cm LEFT ATRIUM              Index       RIGHT ATRIUM           Index LA diam:        4.10 cm  1.94 cm/m  RA Area:     16.70 cm LA Vol (A2C):   105.0 ml 49.78 ml/m RA Volume:   41.20 ml  19.53 ml/m LA Vol (A4C):   56.3 ml  26.69 ml/m LA Biplane Vol: 77.2 ml  36.60 ml/m  AORTIC VALVE LVOT Vmax:   134.50 cm/s LVOT Vmean:  80.300 cm/s LVOT VTI:    0.247 m  AORTA Ao Root diam: 4.00 cm Ao Asc diam:  3.20 cm MITRAL VALVE MV Area (PHT): 3.17 cm    SHUNTS MV Decel Time: 239 msec    Systemic VTI:  0.25 m MV E velocity: 75.40 cm/s  Systemic Diam: 2.30 cm MV A velocity: 82.70 cm/s MV E/A ratio:  0.91 Candee Furbish MD Electronically signed by Candee Furbish MD Signature Date/Time: 11/24/2020/11:25:36 AM    Final     ROS:  As stated above in the HPI otherwise negative.  Blood pressure (!) 149/113, pulse 90, temperature 98.1 F (36.7 C), temperature source Oral, resp. rate 20, height 5\' 9"  (1.753 m), weight 93.8 kg, SpO2 99 %.    PE: Gen: NAD, Alert  and Oriented HEENT:  [place]/AT, EOMI Neck: Supple, no LAD Lungs: CTA Bilaterally CV: RRR without M/G/R ABD: Soft, NTND, +BS Ext: No C/C/E Rectal examination: fresh blood, no masses, no fissures  Assessment/Plan: 1) Hematochezia. 2) Anemia.  3) Vasovagal reaction.    The patient's presentation is consistent with a hemorrhoidal source.  No other pathology was palpated for visualized.  However, with his anemia and being several years out from a colonoscopy, he needs to undergo a repeat evaluation.  He was offered the option of returning to the [place] to maintain consistency or to undergo the procedure during this admission.  He desires to undergo the procedure while in the hospital.  Plan: 1) Colonoscopy tomorrow with Dr. Rush Landmark. Lucendia Leard D 11/24/2020, 3:31 PM

## 2020-11-24 NOTE — ED Notes (Signed)
Tele Breakfast order placed 

## 2020-11-24 NOTE — Progress Notes (Addendum)
   Subjective:   He is feeling ok this morning. He has not had any more dizziness. He feels like he needs to have a BM. Denies chest pain, shortness of breath.   Update: discussed with nursing, he had two bright red bloody bowel movements this morning.    Objective:  Vital signs in last 24 hours: Vitals:   11/24/20 0730 11/24/20 0849 11/24/20 0850 11/24/20 0852  BP: 119/71 (!) 151/103 (!) 143/94 (!) 135/95  Pulse:      Resp: 14     Temp:      TempSrc:      SpO2:        Constitution: NAD, appears stated age HENT: [place]/AT Eyes: eom intact, no icterus or injection  Cardio: irregular , no m/r/g, no LE edema  Respiratory: CTA, no w/r/r Abdominal: NTTP, soft, non-distended MSK: moving all extremities Neuro: normal affect, a&ox3, pleasant  Skin: c/d/i   Assessment/Plan:  Active Problems:   Syncope and collapse  Maynor Neila Gear. Is a 70yo male with PMH of HTN, nephrolithiasis, GERD, and type II diabetes presenting after two syncopal events, one witnessed at home by his friend and another witnessed in the bathroom of the ER while he was using the urinal.  EKG with occasional short pauses.  Syncope Differential includes arrhythmia, vasovagal event, drug intoxication. UDS positive for marijuana only. ED also concerned for possible sinus pauses. He does have irregular sinus rhythm on exam and telemetry, possibly frequent PACs. Echo normal with slight aortic root dilation. He is also having BRBPR but hemoglobin has been stable, this would not be causing his syncope. Orthostatics are negative. He has not had further symptoms of dizziness or syncope since admission.   - full telemetry review once patient reaches floor, cont. Telemetry  - cardiology consulted, appreciate recommendations  Internal Hemorrhoids BRBPR Has been having BRBPR with bowel movements for the past four days, endorses a lot of blood with this. No abdominal pain. States last colonoscopy four years ago had no findings.  Hemoglobin has remained stable. He does have palpable internal hemorrhoids on rectal exam. He is on aspirin 81 mg at home.   -will discuss with GI - he has been having loose stools, will hold miralax for now  - hold asa - SCDs  Mild Aortic Root Dilation Echo showing small aortic root dilation of 6mm - f/u with PCP outpatient for yearly echo or CT assessment of aortic root dilation  HTN - start HCTZ 25 mg qd   TIIDM A1c 7.9. On metformin, unsure what his dosing is.  -cont. SSI    VTE: SCDs IVF: none Diet: HH/CM Code: full   Dispo: Anticipated discharge pending work-up.   Holden Maniscalco A, DO 11/24/2020, 11:56 AM Pager: [phone] After 5pm on weekdays and 1pm on weekends: On Call Pager: [phone]

## 2020-11-24 NOTE — Progress Notes (Signed)
EEG complete - results pending 

## 2020-11-24 NOTE — ED Notes (Signed)
Pt report called...spoke with MD due to bleeding after bowl movement.

## 2020-11-24 NOTE — Progress Notes (Signed)
  Echocardiogram 2D Echocardiogram has been performed.  Jon Robinson G Keiva Dina 11/24/2020, 11:04 AM

## 2020-11-24 NOTE — Progress Notes (Signed)
No further syncopal events. Has had more hematochezia. Occasional PVCs on monitor, no significant bradyarrhytmia/pauses or VT. Echo without significant structural abnormalities. Very mild aortic root dilation is not relevant to the current events, but needs OP follow up. Suspected vasovagal events related to GI bleeding.  [facility] will sign off.   Medication Recommendations:  No changes Other recommendations (labs, testing, etc):  Per primary team. Follow up as an outpatient:  Nonurgent, after DC.

## 2020-11-24 NOTE — Procedures (Signed)
Patient Name: Errol Ala.  MRN: [id number]  Epilepsy Attending: Lora Havens  Referring Physician/Provider: Dr Hayden Rasmussen Date: 11/24/2020 Duration: 27.24 mins  Patient history: 53y M with syncope. EEG to evaluate for seizure  Level of alertness: Awake, asleep  AEDs during EEG study: None  Technical aspects: This EEG study was done with scalp electrodes positioned according to the 10-20 International system of electrode placement. Electrical activity was acquired at a sampling rate of 500Hz  and reviewed with a high frequency filter of 70Hz  and a low frequency filter of 1Hz . EEG data were recorded continuously and digitally stored.   Description: The posterior dominant rhythm consists of 9-10 Hz activity of moderate voltage (25-35 uV) seen predominantly in posterior head regions, symmetric and reactive to eye opening and eye closing. Sleep was characterized by vertex waves, sleep spindles (12 to 14 Hz), maximal frontocentral region.  Hyperventilation did not show any EEG change.  Physiologic photic driving was not seen during photic stimulation.   IMPRESSION: This study is within normal limits. No seizures or epileptiform discharges were seen throughout the recording.  Graydon Fofana Barbra Sarks

## 2020-11-24 NOTE — Consult Note (Signed)
Reason for Consult: Hematochezia Referring Physician: Teaching Service  [place]. HPI: This is a 70 year old male with a PMH of GERD, HTN, DM, and arthritis admitted for complaints of syncope and hematochezia.  Cardiology was consulted and they felt that the syncope was a vasovagal process.  His cardiac work up was negative.  He reports having some problems with hematochezia over the past several days.  Initially he noticed that the blood was on the toilet tissue, but this past Saturday AM at 4-5 AM he had significant bleeding.  He reports that "it was shooting out everywhere".  His HGB appears to be consistent with his prior values from 2019, but he is anemic at 10.6 g/dL.  He receives his care from the [place] and he reports having a normal routine colonoscopy 3-4 years ago.  Past Medical History:  Diagnosis Date  . Arthritis   . Diabetes mellitus without complication ([place])    Diet controlled  . GERD (gastroesophageal reflux disease)   . Headache   . History of kidney stones   . Hypertension   . Neuromuscular disorder ([place])    Finger tips numbness/tingling    Past Surgical History:  Procedure Laterality Date  . FRACTURE SURGERY  1972   Wrist   . KIDNEY STONE SURGERY  10/1997  . KNEE ARTHROPLASTY Right 12/30/2017   Procedure: RIGHT TOTAL KNEE ARTHROPLASTY;  Surgeon: Rod Can, MD;  Location: [facility];  Service: Orthopedics;  Laterality: Right;  Needs RNFA    No family history on file.  Social History:  reports that he quit smoking about 3 years ago. His smoking use included cigarettes. He has a 12.50 pack-year smoking history. He has never used smokeless tobacco. He reports current alcohol use of about 2.0 - 3.0 standard drinks of alcohol per week. He reports current drug use. Drugs: Heroin, "Crack" cocaine, and Marijuana.  Allergies:  Allergies  Allergen Reactions  . Novocain [Procaine] Swelling    In the face  . Penicillins Other (See Comments)    Childhood reaction     Medications:  Scheduled: . hydrochlorothiazide  25 mg Oral Daily  . insulin aspart  0-9 Units Subcutaneous TID WC  . sodium chloride flush  3 mL Intravenous Q12H   Continuous:   Results for orders placed or performed during the hospital encounter of 11/23/20 (from the past 24 hour(s))  CBG monitoring, ED     Status: Abnormal   Collection Time: 11/23/20  4:24 PM  Result Value Ref Range   Glucose-Capillary 189 (H) 70 - 99 mg/dL   Comment 1 Notify RN    Comment 2 Document in Chart   Hepatic function panel     Status: Abnormal   Collection Time: 11/23/20  4:46 PM  Result Value Ref Range   Total Protein 6.1 (L) 6.5 - 8.1 g/dL   Albumin 3.5 3.5 - 5.0 g/dL   AST 19 15 - 41 U/L   ALT 17 0 - 44 U/L   Alkaline Phosphatase 75 38 - 126 U/L   Total Bilirubin 0.6 0.3 - 1.2 mg/dL   Bilirubin, Direct <0.1 0.0 - 0.2 mg/dL   Indirect Bilirubin NOT CALCULATED 0.3 - 0.9 mg/dL  Troponin I (High Sensitivity)     Status: Abnormal   Collection Time: 11/23/20  5:20 PM  Result Value Ref Range   Troponin I (High Sensitivity) 58 (H) <18 ng/L  POC SARS Coronavirus 2 Ag-ED - Nasal Swab     Status: None   Collection  Time: 11/23/20  6:04 PM  Result Value Ref Range   SARS Coronavirus 2 Ag NEGATIVE NEGATIVE  POC occult blood, ED     Status: Abnormal   Collection Time: 11/23/20  7:15 PM  Result Value Ref Range   Fecal Occult Bld POSITIVE (A) NEGATIVE  Troponin I (High Sensitivity)     Status: Abnormal   Collection Time: 11/23/20  7:33 PM  Result Value Ref Range   Troponin I (High Sensitivity) 52 (H) <18 ng/L  CBG monitoring, ED     Status: Abnormal   Collection Time: 11/23/20 10:18 PM  Result Value Ref Range   Glucose-Capillary 214 (H) 70 - 99 mg/dL  Urinalysis, Routine w reflex microscopic Urine, Clean Catch     Status: None   Collection Time: 11/23/20 11:59 PM  Result Value Ref Range   Color, Urine YELLOW YELLOW   APPearance CLEAR CLEAR   Specific Gravity, Urine 1.016 1.005 - 1.030   pH 5.0  5.0 - 8.0   Glucose, UA NEGATIVE NEGATIVE mg/dL   Hgb urine dipstick NEGATIVE NEGATIVE   Bilirubin Urine NEGATIVE NEGATIVE   Ketones, ur NEGATIVE NEGATIVE mg/dL   Protein, ur NEGATIVE NEGATIVE mg/dL   Nitrite NEGATIVE NEGATIVE   Leukocytes,Ua NEGATIVE NEGATIVE  HIV Antibody (routine testing w rflx)     Status: None   Collection Time: 11/24/20  5:11 AM  Result Value Ref Range   HIV Screen 4th Generation wRfx Non Reactive Non Reactive  Magnesium     Status: None   Collection Time: 11/24/20  5:11 AM  Result Value Ref Range   Magnesium 2.2 1.7 - 2.4 mg/dL  TSH     Status: None   Collection Time: 11/24/20  5:11 AM  Result Value Ref Range   TSH 0.923 0.350 - 4.500 uIU/mL  Comprehensive metabolic panel     Status: Abnormal   Collection Time: 11/24/20  5:11 AM  Result Value Ref Range   Sodium 138 135 - 145 mmol/L   Potassium 3.8 3.5 - 5.1 mmol/L   Chloride 104 98 - 111 mmol/L   CO2 26 22 - 32 mmol/L   Glucose, Bld 163 (H) 70 - 99 mg/dL   BUN 14 8 - 23 mg/dL   Creatinine, Ser 0.97 0.61 - 1.24 mg/dL   Calcium 9.2 8.9 - 10.3 mg/dL   Total Protein 5.9 (L) 6.5 - 8.1 g/dL   Albumin 3.3 (L) 3.5 - 5.0 g/dL   AST 18 15 - 41 U/L   ALT 18 0 - 44 U/L   Alkaline Phosphatase 72 38 - 126 U/L   Total Bilirubin 0.4 0.3 - 1.2 mg/dL   GFR, Estimated >60 >60 mL/min   Anion gap 8 5 - 15  Lipid panel     Status: Abnormal   Collection Time: 11/24/20  5:11 AM  Result Value Ref Range   Cholesterol 142 0 - 200 mg/dL   Triglycerides 137 <150 mg/dL   HDL 39 (L) >40 mg/dL   Total CHOL/HDL Ratio 3.6 RATIO   VLDL 27 0 - 40 mg/dL   LDL Cholesterol 76 0 - 99 mg/dL  CBG monitoring, ED     Status: Abnormal   Collection Time: 11/24/20  5:35 AM  Result Value Ref Range   Glucose-Capillary 127 (H) 70 - 99 mg/dL  CBC     Status: Abnormal   Collection Time: 11/24/20  6:14 AM  Result Value Ref Range   WBC 10.4 4.0 - 10.5 K/uL   RBC 4.06 (L)  4.22 - 5.81 MIL/uL   Hemoglobin 10.6 (L) 13.0 - 17.0 g/dL   HCT 33.9  (L) 39.0 - 52.0 %   MCV 83.5 80.0 - 100.0 fL   MCH 26.1 26.0 - 34.0 pg   MCHC 31.3 30.0 - 36.0 g/dL   RDW 15.6 (H) 11.5 - 15.5 %   Platelets 244 150 - 400 K/uL   nRBC 0.0 0.0 - 0.2 %  Hemoglobin A1c     Status: Abnormal   Collection Time: 11/24/20  6:14 AM  Result Value Ref Range   Hgb A1c MFr Bld 7.9 (H) 4.8 - 5.6 %   Mean Plasma Glucose 180.03 mg/dL  CBG monitoring, ED     Status: Abnormal   Collection Time: 11/24/20  8:13 AM  Result Value Ref Range   Glucose-Capillary 133 (H) 70 - 99 mg/dL  CBG monitoring, ED     Status: Abnormal   Collection Time: 11/24/20 12:14 PM  Result Value Ref Range   Glucose-Capillary 140 (H) 70 - 99 mg/dL     EEG  Result Date: 11/24/2020 Lora Havens, MD     11/24/2020 12:24 PM Patient Name: Jon Robinson. MRN: [id number] Epilepsy Attending: Lora Havens Referring Physician/Provider: Dr Hayden Rasmussen Date: 11/24/2020 Duration: 27.24 mins Patient history: 57y M with syncope. EEG to evaluate for seizure Level of alertness: Awake, asleep AEDs during EEG study: None Technical aspects: This EEG study was done with scalp electrodes positioned according to the 10-20 International system of electrode placement. Electrical activity was acquired at a sampling rate of 500Hz  and reviewed with a high frequency filter of 70Hz  and a low frequency filter of 1Hz . EEG data were recorded continuously and digitally stored. Description: The posterior dominant rhythm consists of 9-10 Hz activity of moderate voltage (25-35 uV) seen predominantly in posterior head regions, symmetric and reactive to eye opening and eye closing. Sleep was characterized by vertex waves, sleep spindles (12 to 14 Hz), maximal frontocentral region.  Hyperventilation did not show any EEG change.  Physiologic photic driving was not seen during photic stimulation. IMPRESSION: This study is within normal limits. No seizures or epileptiform discharges were seen throughout the recording. Lora Havens    CT Head Wo Contrast  Result Date: 11/23/2020 CLINICAL DATA:  Syncope, simple, abnormal neuro exam EXAM: CT HEAD WITHOUT CONTRAST TECHNIQUE: Contiguous axial images were obtained from the base of the skull through the vertex without intravenous contrast. COMPARISON:  None. FINDINGS: Brain: Brain volume is normal for age. No intracranial hemorrhage, mass effect, or midline shift. No hydrocephalus. The basilar cisterns are patent. There is moderate periventricular and deep white matter hypodensity. No evidence of territorial infarct or acute ischemia. No extra-axial or intracranial fluid collection. Vascular: No hyperdense vessel. Skull: No fracture or focal lesion. Sinuses/Orbits: Scattered mucosal thickening throughout the ethmoid air cells and maxillary sinuses. No sinus fluid level. Orbits are unremarkable. Mastoid air cells are clear. Other: None. IMPRESSION: 1. No acute intracranial abnormality. 2. Moderate periventricular and deep white matter hypodensity, nonspecific but typically chronic small vessel ischemia. Electronically Signed   By: Keith Rake M.D.   On: 11/23/2020 17:54   ECHOCARDIOGRAM COMPLETE  Result Date: 11/24/2020    ECHOCARDIOGRAM REPORT   Patient Name:   Amilcar Reever. Date of Exam: 11/24/2020 Medical Rec #:  [id number]       Height:       69.0 in Accession #:    [id number]      Weight:  210.0 lb Date of Birth:  [birth year]       BSA:          2.109 m Patient Age:    51 years        BP:           135/93 mmHg Patient Gender: M               HR:           69 bpm. Exam Location:  Inpatient Procedure: 2D Echo, Cardiac Doppler and Color Doppler Indications:    R55 Syncope  History:        Patient has no prior history of Echocardiogram examinations.                 Risk Factors:Hypertension, Diabetes and GERD.  Sonographer:    Jonelle Sidle Dance Referring Phys: [place]  1. Left ventricular ejection fraction, by estimation, is 50 to 55%. The left ventricle has low  normal function. The left ventricle has no regional wall motion abnormalities. There is mild left ventricular hypertrophy. Left ventricular diastolic parameters are consistent with Grade I diastolic dysfunction (impaired relaxation).  2. Right ventricular systolic function is normal. The right ventricular size is normal.  3. Left atrial size was moderately dilated.  4. The mitral valve is normal in structure. No evidence of mitral valve regurgitation. No evidence of mitral stenosis.  5. The aortic valve is calcified. There is mild calcification of the aortic valve. Aortic valve regurgitation is not visualized. Mild aortic valve sclerosis is present, with no evidence of aortic valve stenosis.  6. There is mild dilatation at the level of the sinuses of Valsalva, measuring 40 mm.  7. The inferior vena cava is normal in size with greater than 50% respiratory variability, suggesting right atrial pressure of 3 mmHg. FINDINGS  Left Ventricle: Left ventricular ejection fraction, by estimation, is 50 to 55%. The left ventricle has low normal function. The left ventricle has no regional wall motion abnormalities. The left ventricular internal cavity size was normal in size. There is mild left ventricular hypertrophy. Left ventricular diastolic parameters are consistent with Grade I diastolic dysfunction (impaired relaxation). Right Ventricle: The right ventricular size is normal. No increase in right ventricular wall thickness. Right ventricular systolic function is normal. Left Atrium: Left atrial size was moderately dilated. Right Atrium: Right atrial size was normal in size. Pericardium: There is no evidence of pericardial effusion. Mitral Valve: The mitral valve is normal in structure. No evidence of mitral valve regurgitation. No evidence of mitral valve stenosis. Tricuspid Valve: The tricuspid valve is normal in structure. Tricuspid valve regurgitation is not demonstrated. No evidence of tricuspid stenosis. Aortic  Valve: The aortic valve is calcified. There is mild calcification of the aortic valve. Aortic valve regurgitation is not visualized. Mild aortic valve sclerosis is present, with no evidence of aortic valve stenosis. Pulmonic Valve: The pulmonic valve was normal in structure. Pulmonic valve regurgitation is trivial. No evidence of pulmonic stenosis. Aorta: The aortic root is normal in size and structure. There is mild dilatation at the level of the sinuses of Valsalva, measuring 40 mm. Venous: The inferior vena cava is normal in size with greater than 50% respiratory variability, suggesting right atrial pressure of 3 mmHg. IAS/Shunts: No atrial level shunt detected by color flow Doppler.  LEFT VENTRICLE PLAX 2D LVIDd:         4.10 cm LVIDs:         3.00 cm LV  PW:         1.30 cm LV IVS:        1.20 cm LVOT diam:     2.30 cm LV SV:         103 LV SV Index:   49 LVOT Area:     4.15 cm  RIGHT VENTRICLE          IVC RV Basal diam:  2.70 cm  IVC diam: 2.50 cm TAPSE (M-mode): 2.3 cm LEFT ATRIUM              Index       RIGHT ATRIUM           Index LA diam:        4.10 cm  1.94 cm/m  RA Area:     16.70 cm LA Vol (A2C):   105.0 ml 49.78 ml/m RA Volume:   41.20 ml  19.53 ml/m LA Vol (A4C):   56.3 ml  26.69 ml/m LA Biplane Vol: 77.2 ml  36.60 ml/m  AORTIC VALVE LVOT Vmax:   134.50 cm/s LVOT Vmean:  80.300 cm/s LVOT VTI:    0.247 m  AORTA Ao Root diam: 4.00 cm Ao Asc diam:  3.20 cm MITRAL VALVE MV Area (PHT): 3.17 cm    SHUNTS MV Decel Time: 239 msec    Systemic VTI:  0.25 m MV E velocity: 75.40 cm/s  Systemic Diam: 2.30 cm MV A velocity: 82.70 cm/s MV E/A ratio:  0.91 Candee Furbish MD Electronically signed by Candee Furbish MD Signature Date/Time: 11/24/2020/11:25:36 AM    Final     ROS:  As stated above in the HPI otherwise negative.  Blood pressure (!) 149/113, pulse 90, temperature 98.1 F (36.7 C), temperature source Oral, resp. rate 20, height 5\' 9"  (1.753 m), weight 93.8 kg, SpO2 99 %.    PE: Gen: NAD, Alert  and Oriented HEENT:  [place]/AT, EOMI Neck: Supple, no LAD Lungs: CTA Bilaterally CV: RRR without M/G/R ABD: Soft, NTND, +BS Ext: No C/C/E Rectal examination: fresh blood, no masses, no fissures  Assessment/Plan: 1) Hematochezia. 2) Anemia.  3) Vasovagal reaction.    The patient's presentation is consistent with a hemorrhoidal source.  No other pathology was palpated for visualized.  However, with his anemia and being several years out from a colonoscopy, he needs to undergo a repeat evaluation.  He was offered the option of returning to the [place] to maintain consistency or to undergo the procedure during this admission.  He desires to undergo the procedure while in the hospital.  Plan: 1) Colonoscopy tomorrow with Dr. Rush Landmark. Wrigley Plasencia D 11/24/2020, 3:31 PM

## 2020-11-24 NOTE — Progress Notes (Signed)
Date: 11/24/2020  Patient name: Jon Robinson.  Medical record number: [id number]  Date of birth: Sep 30, 1951   I have seen and evaluated Jon Robinson. and discussed their care with the Residency Team. In brief, patient is a 70 year old male with a past medical history of hypertension, nephrolithiasis, GERD and type 2 diabetes who presented to the ED after a syncopal event at home.  Patient states that he was smoking marijuana yesterday and then stood up and felt lightheaded. He sat down in a chair and then the next thing he knew he was waking up and noted his hands were wet. A person there told him he was foaming at the mouth but did not note any seizure activity. Patient came to the ED for further evaluation. While patient was in the ED waiting room he went to the bathroom and while he was straining to urinate felt lightheaded and sat on the toilet and then passed out. He woke up the people helping him. His prior episode of syncope was when he was younger and was in the [place] and also occurred while standing up suddenly. Of note, patient did note some bright red blood per rectum beginning 2 days prior to admission. Patient states that he noted some blood on the toilet paper after wiping. Yesterday morning, patient noted blood in the toilet bowl which was significant. No chest pain, no palpitations, no abdominal pain, no nausea or vomiting, no fevers or chills, no sick contacts.  Today, patient had recurrent bright red blood per rectum. No further episodes of syncope noted. He did not feel lightheaded on standing up today.  PMHx, Fam Hx, and/or Soc Hx : As per resident admit note  Vitals:   11/24/20 0852 11/24/20 1202  BP: (!) 135/95 (!) 152/100  Pulse:  91  Resp:  (!) 22  Temp:  98 F (36.7 C)  SpO2:  98%   General: Awake, alert, oriented x3, NAD CVS: Irregular at times, normal heart sounds Lungs: CTA bilaterally Abdomen: Soft, nontender, nondistended, normoactive bowel  sounds Extremities: No edema noted, nontender to palpation Psych: Normal mood and affect HEENT: Normocephalic, atraumatic Skin: Warm and dry  Assessment and Plan: I have seen and evaluated the patient as outlined above. I agree with the formulated Assessment and Plan as detailed in the residents' note, with the following changes:   1. Syncope: -Patient presented to ED with recurrent syncope. Etiology behind his syncope remains uncertain at this time but both episodes were preceded by lightheadedness prior to losing consciousness consistent with a vasovagal-like episode. Patient was also noted to have PVCs and short sinus pauses on EKG here and there is concern for possible arrhythmia causing his syncopal episode. Patient also had episodes of bright red blood per rectum and hypovolemia could have been the cause of his syncope but his hemoglobin has remained relatively stable and his orthostatics were negative here. Patient was also noted to be foaming at the mouth by an observer after his first episode of syncope even though there was no tonic-clonic movement noted. It is possible that his syncopal episode may have been secondary to a seizure. -EKG with multiple PVCs and a short sinus pause. Cardiology follow-up and recommendations appreciated. We will continue to monitor the patient on telemetry and monitor for recurrent events. -2D echo with normal EF and grade 1 diastolic dysfunction as well as mild aortic root dilatation of 4 cm. Patient will need follow-up for this as an outpatient with his PCP for yearly assessment. -  EEG obtained which showed no evidence of seizure activity. -I suspect that the patient's likely etiology for syncope was vasovagal in nature. -No further work-up at this time. We will continue to monitor closely  2. GI bleed: -Patient's hemoglobin is mildly reduced today to 10.6 from 11.4 on admission. He did have a recurrent episode of bright red blood per rectum today but his  orthostatics were negative making hypovolemia unlikely to be the cause of his syncope.  -Hold aspirin -However, patient does need further evaluation for his GI bleed. Will obtain GI evaluation for possible colonoscopy.  -We will monitor CBC closely. Will transfuse for hemoglobin less than seven. -Of note, ED PA noted external and internal hemorrhoids on rectal exam which could be the etiology for his recurrent GI bleed  Aldine Contes, MD 1/30/202212:33 PM

## 2020-11-25 ENCOUNTER — Encounter (HOSPITAL_COMMUNITY)
Admission: EM | Disposition: A | Discharge status: Home / Self Care | Payer: Self-pay | Source: Home / Self Care | Attending: Internal Medicine

## 2020-11-25 ENCOUNTER — Inpatient Hospital Stay (HOSPITAL_COMMUNITY): Payer: No Typology Code available for payment source | Admitting: Anesthesiology

## 2020-11-25 ENCOUNTER — Encounter (HOSPITAL_COMMUNITY): Payer: Self-pay | Admitting: Internal Medicine

## 2020-11-25 DIAGNOSIS — E119 Type 2 diabetes mellitus without complications: Secondary | ICD-10-CM

## 2020-11-25 DIAGNOSIS — K649 Unspecified hemorrhoids: Secondary | ICD-10-CM

## 2020-11-25 DIAGNOSIS — D649 Anemia, unspecified: Secondary | ICD-10-CM

## 2020-11-25 DIAGNOSIS — K635 Polyp of colon: Secondary | ICD-10-CM

## 2020-11-25 DIAGNOSIS — I10 Essential (primary) hypertension: Secondary | ICD-10-CM

## 2020-11-25 DIAGNOSIS — K921 Melena: Secondary | ICD-10-CM

## 2020-11-25 HISTORY — PX: COLONOSCOPY WITH PROPOFOL: SHX5780

## 2020-11-25 LAB — GLUCOSE, CAPILLARY
Glucose-Capillary: 184 mg/dL — ABNORMAL HIGH (ref 70–99)
Glucose-Capillary: 217 mg/dL — ABNORMAL HIGH (ref 70–99)
Glucose-Capillary: 112 mg/dL — ABNORMAL HIGH (ref 70–99)

## 2020-11-25 SURGERY — COLONOSCOPY WITH PROPOFOL
Anesthesia: Monitor Anesthesia Care

## 2020-11-25 MED ORDER — SODIUM CHLORIDE 0.9 % IV SOLN
250.0000 mg | Freq: Once | INTRAVENOUS | Status: AC
  Administered 2020-11-25: 250 mg via INTRAVENOUS
  Filled 2020-11-25: qty 20

## 2020-11-25 MED ORDER — LABETALOL HCL 5 MG/ML IV SOLN
10.0000 mg | Freq: Once | INTRAVENOUS | Status: AC
  Administered 2020-11-25: 10 mg via INTRAVENOUS

## 2020-11-25 MED ORDER — SODIUM CHLORIDE 0.9 % IV SOLN: INTRAVENOUS | Status: DC | PRN

## 2020-11-25 MED ORDER — DEXMEDETOMIDINE (PRECEDEX) IN NS 20 MCG/5ML (4 MCG/ML) IV SYRINGE
PREFILLED_SYRINGE | INTRAVENOUS | Status: DC | PRN
  Administered 2020-11-25: 8 ug via INTRAVENOUS

## 2020-11-25 MED ORDER — METOCLOPRAMIDE HCL 5 MG/ML IJ SOLN
5.0000 mg | Freq: Once | INTRAMUSCULAR | Status: AC
  Administered 2020-11-25: 5 mg via INTRAVENOUS
  Filled 2020-11-25: qty 2

## 2020-11-25 MED ORDER — PROPOFOL 10 MG/ML IV BOLUS
INTRAVENOUS | Status: DC | PRN
  Administered 2020-11-25: 20 mg via INTRAVENOUS

## 2020-11-25 MED ORDER — SODIUM CHLORIDE 0.9 % IV SOLN: 510.0000 mg | Freq: Once | INTRAVENOUS | Status: DC

## 2020-11-25 MED ORDER — PROPOFOL 500 MG/50ML IV EMUL
INTRAVENOUS | Status: DC | PRN
  Administered 2020-11-25: 100 ug/kg/min via INTRAVENOUS
  Administered 2020-11-25: 80 ug/kg/min via INTRAVENOUS

## 2020-11-25 MED ORDER — PEG-KCL-NACL-NASULF-NA ASC-C 100 G PO SOLR
1.0000 | Freq: Once | ORAL | Status: AC
  Administered 2020-11-25: 200 g via ORAL
  Filled 2020-11-25: qty 1

## 2020-11-25 MED ORDER — LIDOCAINE 2% (20 MG/ML) 5 ML SYRINGE
INTRAMUSCULAR | Status: DC | PRN
  Administered 2020-11-25: 80 mg via INTRAVENOUS

## 2020-11-25 MED ORDER — PHENYLEPHRINE 40 MCG/ML (10ML) SYRINGE FOR IV PUSH (FOR BLOOD PRESSURE SUPPORT)
PREFILLED_SYRINGE | INTRAVENOUS | Status: DC | PRN
  Administered 2020-11-25 (×3): 80 ug via INTRAVENOUS
  Administered 2020-11-25: 120 ug via INTRAVENOUS
  Administered 2020-11-25 (×2): 80 ug via INTRAVENOUS

## 2020-11-25 MED ORDER — LABETALOL HCL 5 MG/ML IV SOLN
INTRAVENOUS | Status: AC
  Filled 2020-11-25: qty 4

## 2020-11-25 MED ORDER — FLEET ENEMA 7-19 GM/118ML RE ENEM
1.0000 | ENEMA | Freq: Once | RECTAL | Status: AC
  Administered 2020-11-25: 1 via RECTAL
  Filled 2020-11-25: qty 1

## 2020-11-25 MED ORDER — PHENYLEPHRINE HCL-NACL 10-0.9 MG/250ML-% IV SOLN
INTRAVENOUS | Status: DC | PRN
  Administered 2020-11-25: 25 ug/min via INTRAVENOUS

## 2020-11-25 SURGICAL SUPPLY — 22 items

## 2020-11-25 NOTE — Interval H&P Note (Signed)
History and Physical Interval Note:  11/25/2020 11:05 AM  Jon Robinson.  has presented today for surgery, with the diagnosis of Hematochezia and anemia.  The various methods of treatment have been discussed with the patient and family. After consideration of risks, benefits and other options for treatment, the patient has consented to  Procedure(s): COLONOSCOPY WITH PROPOFOL (N/A) as a surgical intervention.  The patient's history has been reviewed, patient examined, no change in status, stable for surgery.  I have reviewed the patient's chart and labs.  Questions were answered to the patient's satisfaction.     [employer]

## 2020-11-25 NOTE — Anesthesia Preprocedure Evaluation (Addendum)
Anesthesia Evaluation  Patient identified by MRN, date of birth, ID band Patient awake    Reviewed: Allergy & Precautions, NPO status , Patient's Chart, lab work & pertinent test results  History of Anesthesia Complications Negative for: history of anesthetic complications  Airway Mallampati: II  TM Distance: >3 FB Neck ROM: Full    Dental  (+) Edentulous Upper, Edentulous Lower   Pulmonary former smoker,    Pulmonary exam normal        Cardiovascular hypertension (poorly controlled), Pt. on medications  Rhythm:Regular Rate:Tachycardia   '22 TTE - EF 50 to 55%. Mild left ventricular hypertrophy.  Grade I diastolic dysfunction (impaired relaxation). LA was moderately dilated. Mild aortic valve sclerosis is present, with no evidence of aortic valve stenosis. There is mild dilatation at the level of the sinuses of Valsalva, measuring 40 mm.    Neuro/Psych  Headaches,  Neuromuscular disease negative psych ROS   GI/Hepatic Neg liver ROS, GERD  Controlled,  Endo/Other  diabetes, Type 2, Oral Hypoglycemic Agents Obesity   Renal/GU negative Renal ROS     Musculoskeletal  (+) Arthritis ,   Abdominal   Peds  Hematology  (+) anemia ,   Anesthesia Other Findings Covid test negative   Reproductive/Obstetrics                            Anesthesia Physical Anesthesia Plan  ASA: III  Anesthesia Plan: MAC   Post-op Pain Management:    Induction: Intravenous  PONV Risk Score and Plan: 1 and Propofol infusion and Treatment may vary due to age or medical condition  Airway Management Planned: Natural Airway and Simple Face Mask  Additional Equipment: None  Intra-op Plan:   Post-operative Plan:   Informed Consent: I have reviewed the patients History and Physical, chart, labs and discussed the procedure including the risks, benefits and alternatives for the proposed anesthesia with the patient  or authorized representative who has indicated his/her understanding and acceptance.       Plan Discussed with: CRNA and Anesthesiologist  Anesthesia Plan Comments:        Anesthesia Quick Evaluation

## 2020-11-25 NOTE — Progress Notes (Signed)
Subjective:   Overnight, no acute events.  This morning, patient reports that he feels well and denies any specific complaints or concerns. He denies any repeat episodes of lightheadedness, dizziness or syncope overnight. He reports that he is looking forward to getting his colonoscopy done and hoping to be home before Thursday. We discussed with patient that we will have more information regarding a discharge timeline following his evaluation by GI. He has no further questions.  Objective:  Vital signs in last 24 hours: Vitals:   11/25/20 0753 11/25/20 1038 11/25/20 1041 11/25/20 1049  BP:  (!) 187/105  (!) 179/87  Pulse:  (!) 104    Resp:  17    Temp: 100 F (37.8 C)  99 F (37.2 C)   TempSrc: Oral  Temporal   SpO2:  100%    Weight:      Height:        Intake/Output Summary (Last 24 hours) at 11/25/2020 1216 Last data filed at 11/25/2020 0600 Gross per 24 hour  Intake 204.64 ml  Output 350 ml  Net -145.36 ml   Filed Weights   11/24/20 1324 11/25/20 0023 11/25/20 0500  Weight: 93.8 kg 92.4 kg 92.4 kg  Physical Exam Vitals reviewed.  Constitutional:      General: He is not in acute distress.    Appearance: He is not ill-appearing.  Cardiovascular:     Rate and Rhythm: Normal rate and regular rhythm.     Pulses: Normal pulses.     Heart sounds: Normal heart sounds.  Pulmonary:     Effort: Pulmonary effort is normal. No respiratory distress.     Breath sounds: Normal breath sounds.  Abdominal:     General: Abdomen is flat. Bowel sounds are normal.     Palpations: Abdomen is soft.     Tenderness: There is no abdominal tenderness.  Musculoskeletal:        General: Normal range of motion.     Right lower leg: No edema.     Left lower leg: No edema.  Skin:    General: Skin is warm and dry.     Capillary Refill: Capillary refill takes less than 2 seconds.  Neurological:     General: No focal deficit present.     Mental Status: He is alert and oriented to person,  place, and time. Mental status is at baseline.  Psychiatric:        Mood and Affect: Mood normal.        Behavior: Behavior normal.   Labs in last 24 hours: CBC Latest Ref Rng & Units 11/24/2020 11/24/2020 11/23/2020  WBC 4.0 - 10.5 K/uL 10.0 10.4 11.0(H)  Hemoglobin 13.0 - 17.0 g/dL 10.4(L) 10.6(L) 11.4(L)  Hematocrit 39.0 - 52.0 % 31.6(L) 33.9(L) 35.3(L)  Platelets 150 - 400 K/uL 250 244 259   HbA1c: 7.9 Glucose: 184, 151, 141, 140, 133 Iron and TIBC: -Iron 106 -TIBC 277 -Saturation Ratios 38 -UIBC 171 Ferritin 77  Assessment/Plan:  Active Problems:   Syncope and collapse  [name] is a 70 year old male with past medical history significant for HTN, T2DM and GERD who presented to [facility] on 01/29 for evaluation of syncope in the setting of hematochezia and THC consumption.  #Vasovagal syncope, resolved Patient presented following two episodes of syncope following consumption of THC. Cardiology evaluated patient, laboratory and imaging and recommended that patient continue to follow in outpatient setting, however not concerned for a cardiac source of his symptoms. EEG obtained which  was unremarkable. Orthostatics negative. Most likely cause of syncope is vasovagal in the setting of hematochezia. -Continue telemetry -Follow-up cardiology in outpatient setting  #Normocytic anemia 2/2 hematochezia in setting of internal/external hemorrhoids, active Patient presented with bright red blood per rectum for four days prior to arrival. GI consulted with recommendation to pursue colonoscopy today. Hemoglobin relatively stable since admission. -GI consulted, appreciate recommendations  -Follow-up colonoscopy today -Continue holding aspirin -Continue SCDs  #Mild aortic root dilation, active Echocardiogram this admission showing aortic root dilation of 24mm -Continue following with cardiology in outpatient setting  #HTN, chronic -Continue home HCTZ 25mg  nightly -Continue  following with PCP for further dosing of his antihypertensive regimen  #T2DM, chronic Hemoglobin of 7.9 on admission. Blood glucose readings within 100s over past 24 hours. -Continue sensitive SSI  #VTE ppx: SCDs #IVF: None #Diet: NPO #Code status: Full code  Cato Mulligan, MD 11/25/2020, 12:16 PM Pager: [phone] After 5pm on weekdays and 1pm on weekends: On Call Pager: [phone]

## 2020-11-25 NOTE — Anesthesia Postprocedure Evaluation (Signed)
Anesthesia Post Note  Patient: Stefen Juba.  Procedure(s) Performed: COLONOSCOPY WITH PROPOFOL (N/A )     Patient location during evaluation: PACU Anesthesia Type: MAC Level of consciousness: awake and alert Pain management: pain level controlled Vital Signs Assessment: post-procedure vital signs reviewed and stable Respiratory status: spontaneous breathing, nonlabored ventilation and respiratory function stable Cardiovascular status: stable and blood pressure returned to baseline Anesthetic complications: no   No complications documented.  Last Vitals:  Vitals:   11/25/20 1235 11/25/20 1249  BP: 124/75 124/84  Pulse: 94 98  Resp: 16 (!) 25  Temp: 36.4 C   SpO2: 100% 100%    Last Pain:  Vitals:   11/25/20 1249  TempSrc:   PainSc: 0-No pain                 Audry Pili

## 2020-11-25 NOTE — Transfer of Care (Signed)
Immediate Anesthesia Transfer of Care Note  Patient: Jon Robinson.  Procedure(s) Performed: COLONOSCOPY WITH PROPOFOL (N/A )  Patient Location: Endoscopy Unit  Anesthesia Type:MAC  Level of Consciousness: drowsy and patient cooperative  Airway & Oxygen Therapy: Patient Spontanous Breathing  Post-op Assessment: Report given to RN, Post -op Vital signs reviewed and stable and Patient moving all extremities  Post vital signs: Reviewed and stable  Last Vitals:  Vitals Value Taken Time  BP 125/84   Temp    Pulse 26 11/25/20 1235  Resp 21 11/25/20 1238  SpO2 100 % 11/25/20 1235  Vitals shown include unvalidated device data.  Last Pain:  Vitals:   11/25/20 1041  TempSrc: Temporal  PainSc:          Complications: No complications documented.

## 2020-11-25 NOTE — Op Note (Signed)
[facility] Patient Name: Jon Robinson Procedure Date : 11/25/2020 MRN: [id number] Attending MD: Justice Britain , MD Date of Birth: September 06, 1951 CSN: [id number] Age: 70 Admit Type: Inpatient Procedure:                Colonoscopy Indications:              Hematochezia Providers:                Justice Britain, MD, Particia Nearing, RN, Elspeth Cho Tech., Technician Referring MD:             Carol Ada, MD, Inpatient Medical Service Medicines:                Monitored Anesthesia Care Complications:            No immediate complications. Estimated Blood Loss:     Estimated blood loss was minimal. Estimated blood                            loss: none. Procedure:                Pre-Anesthesia Assessment:                           - Prior to the procedure, a History and Physical                            was performed, and patient medications and                            allergies were reviewed. The patient's tolerance of                            previous anesthesia was also reviewed. The risks                            and benefits of the procedure and the sedation                            options and risks were discussed with the patient.                            All questions were answered, and informed consent                            was obtained. Prior Anticoagulants: The patient has                            taken no previous anticoagulant or antiplatelet                            agents. ASA Grade Assessment: III - A patient with  severe systemic disease. After reviewing the risks                            and benefits, the patient was deemed in                            satisfactory condition to undergo the procedure.                           After obtaining informed consent, the colonoscope                            was passed under direct vision. Throughout the                             procedure, the patient's blood pressure, pulse, and                            oxygen saturations were monitored continuously. The                            CF-HQ190L ([id number]) Olympus colonoscope was                            introduced through the anus and advanced to the 5                            cm into the ileum. The colonoscopy was technically                            difficult and complex due to excessive bleeding.                            Successful completion of the procedure was aided by                            lavage. The patient tolerated the procedure. The                            quality of the bowel preparation was adequate. Scope In: 11:32:37 AM Scope Out: 12:25:48 PM Scope Withdrawal Time: 0 hours 46 minutes 10 seconds  Total Procedure Duration: 0 hours 53 minutes 11 seconds  Findings:      The digital rectal exam findings include hemorrhoids. Pertinent       negatives include no palpable rectal lesions.      The terminal ileum and ileocecal valve appeared normal.      Clotted blood and red tinged water/preparation was found in the entire       colon. Lavage of the entire area as well as suction via endoscope of the       large clot burden was performed using copious amounts, resulting in       clearance with adequate visualization.      Many small and large-mouthed diverticula were found in the colon. No       active  source of diverticular hemorrhage is noted on today's examination.      A few sessile polyps were found in the recto-sigmoid colon, sigmoid       colon and descending colon. The polyps were 2 to 5 mm in size.       Polypectomy was not attempted due to intent of today's procedure and to       not cloud if patient has recurrent bleeding.      Non-bleeding non-thrombosed external and internal hemorrhoids were found       during retroflexion, during perianal exam and during digital exam. The       hemorrhoids were Grade II (internal hemorrhoids  that prolapse but reduce       spontaneously). Impression:               - Hemorrhoids found on digital rectal exam.                           - The examined portion of the ileum was normal.                           - Blood in the entire examined colon.                           - Diverticulosis.                           - A few 2 to 5 mm polyps at the recto-sigmoid                            colon, in the sigmoid colon and in the descending                            colon. Resection not attempted.                           - Non-bleeding non-thrombosed external and internal                            hemorrhoids.                           - I evaluated and intubated the cecum on 6                            occasions during the washing/clearance phases of                            the procedure from the rectum to cecum to evaluate                            for any active source of bleeding, but non were                            found on today's examination.                           -  Etiology of presentation is from diverticular                            hemorrhage. Recommendation:           - The patient will be observed post-procedure,                            until all discharge criteria are met.                           - Return patient to hospital ward for ongoing care.                           - Advance diet as tolerated.                           - Continue present medications.                           - Patient will still have some evidence of old                            blood over the next 24 hours to occur. With that                            being said, if the patient becomes transfusion                            dependent or has severe hematochezia recur then                            would recommend a CT-Angiography vs Tagged RBC scan                            for further evaluation.                           - Repeat colonoscopy in 6-12 months for                             surveillance of multiple polyps which can be done                            at the [place] or if the patient wants to be seen in [place]                            then he can have his [place] PCP send referral to                            [place] and we would be happy to evaluate..                           - Recommend trending of Hgb/Hct as  inpatient for                            next 24 hours.                           - Recommend IV Iron per hospitalist team if they                            feel comfortable with administration.                           - The findings and recommendations were discussed                            with the patient.                           - The findings and recommendations were discussed                            with the referring physician. Procedure Code(s):        --- Professional ---                           [id number], Colonoscopy, flexible; diagnostic, including                            collection of specimen(s) by brushing or washing,                            when performed (separate procedure) Diagnosis Code(s):        --- Professional ---                           K64.1, Second degree hemorrhoids                           K92.2, Gastrointestinal hemorrhage, unspecified                           K63.5, Polyp of colon                           K92.1, Melena (includes Hematochezia)                           K57.30, Diverticulosis of large intestine without                            perforation or abscess without bleeding CPT copyright 2019 American Medical Association. All rights reserved. The codes documented in this report are preliminary and upon coder review may  be revised to meet current compliance requirements. Justice Britain, MD 11/25/2020 12:38:07 PM Number of Addenda: 0

## 2020-11-26 ENCOUNTER — Inpatient Hospital Stay (HOSPITAL_COMMUNITY): Payer: No Typology Code available for payment source

## 2020-11-26 ENCOUNTER — Encounter (HOSPITAL_COMMUNITY): Payer: Self-pay | Admitting: Gastroenterology

## 2020-11-26 DIAGNOSIS — K5731 Diverticulosis of large intestine without perforation or abscess with bleeding: Principal | ICD-10-CM

## 2020-11-26 DIAGNOSIS — K5791 Diverticulosis of intestine, part unspecified, without perforation or abscess with bleeding: Secondary | ICD-10-CM

## 2020-11-26 LAB — CBC
HCT: 27.4 % — ABNORMAL LOW (ref 39.0–52.0)
HCT: 23.9 % — ABNORMAL LOW (ref 39.0–52.0)
HCT: 23.8 % — ABNORMAL LOW (ref 39.0–52.0)
Hemoglobin: 8.7 g/dL — ABNORMAL LOW (ref 13.0–17.0)
Hemoglobin: 7.5 g/dL — ABNORMAL LOW (ref 13.0–17.0)
Hemoglobin: 7.9 g/dL — ABNORMAL LOW (ref 13.0–17.0)
MCH: 26.4 pg (ref 26.0–34.0)
MCH: 26.1 pg (ref 26.0–34.0)
MCH: 27.8 pg (ref 26.0–34.0)
MCHC: 31.8 g/dL (ref 30.0–36.0)
MCHC: 31.4 g/dL (ref 30.0–36.0)
MCHC: 33.2 g/dL (ref 30.0–36.0)
MCV: 83 fL (ref 80.0–100.0)
MCV: 83.3 fL (ref 80.0–100.0)
MCV: 83.8 fL (ref 80.0–100.0)
Platelets: 240 10*3/uL (ref 150–400)
Platelets: 225 10*3/uL (ref 150–400)
Platelets: 198 10*3/uL (ref 150–400)
RBC: 3.3 MIL/uL — ABNORMAL LOW (ref 4.22–5.81)
RBC: 2.87 MIL/uL — ABNORMAL LOW (ref 4.22–5.81)
RBC: 2.84 MIL/uL — ABNORMAL LOW (ref 4.22–5.81)
RDW: 15.5 % (ref 11.5–15.5)
RDW: 15.5 % (ref 11.5–15.5)
RDW: 16.6 % — ABNORMAL HIGH (ref 11.5–15.5)
WBC: 13.7 10*3/uL — ABNORMAL HIGH (ref 4.0–10.5)
WBC: 13 10*3/uL — ABNORMAL HIGH (ref 4.0–10.5)
WBC: 13.7 10*3/uL — ABNORMAL HIGH (ref 4.0–10.5)
nRBC: 0 % (ref 0.0–0.2)
nRBC: 0.2 % (ref 0.0–0.2)
nRBC: 0.4 % — ABNORMAL HIGH (ref 0.0–0.2)

## 2020-11-26 LAB — GLUCOSE, CAPILLARY
Glucose-Capillary: 265 mg/dL — ABNORMAL HIGH (ref 70–99)
Glucose-Capillary: 161 mg/dL — ABNORMAL HIGH (ref 70–99)
Glucose-Capillary: 155 mg/dL — ABNORMAL HIGH (ref 70–99)
Glucose-Capillary: 235 mg/dL — ABNORMAL HIGH (ref 70–99)

## 2020-11-26 LAB — CBC WITH DIFFERENTIAL/PLATELET
Abs Immature Granulocytes: 0.08 10*3/uL — ABNORMAL HIGH (ref 0.00–0.07)
Basophils Absolute: 0.1 10*3/uL (ref 0.0–0.1)
Basophils Relative: 1 %
Eosinophils Absolute: 0.1 10*3/uL (ref 0.0–0.5)
Eosinophils Relative: 1 %
HCT: 24.3 % — ABNORMAL LOW (ref 39.0–52.0)
Hemoglobin: 8.1 g/dL — ABNORMAL LOW (ref 13.0–17.0)
Immature Granulocytes: 1 %
Lymphocytes Relative: 20 %
Lymphs Abs: 2.4 10*3/uL (ref 0.7–4.0)
MCH: 27.1 pg (ref 26.0–34.0)
MCHC: 33.3 g/dL (ref 30.0–36.0)
MCV: 81.3 fL (ref 80.0–100.0)
Monocytes Absolute: 1.5 10*3/uL — ABNORMAL HIGH (ref 0.1–1.0)
Monocytes Relative: 13 %
Neutro Abs: 7.6 10*3/uL (ref 1.7–7.7)
Neutrophils Relative %: 64 %
Platelets: 228 10*3/uL (ref 150–400)
RBC: 2.99 MIL/uL — ABNORMAL LOW (ref 4.22–5.81)
RDW: 15.6 % — ABNORMAL HIGH (ref 11.5–15.5)
WBC: 11.7 10*3/uL — ABNORMAL HIGH (ref 4.0–10.5)
nRBC: 0 % (ref 0.0–0.2)

## 2020-11-26 LAB — BASIC METABOLIC PANEL
Anion gap: 9 (ref 5–15)
BUN: 15 mg/dL (ref 8–23)
CO2: 23 mmol/L (ref 22–32)
Calcium: 8.2 mg/dL — ABNORMAL LOW (ref 8.9–10.3)
Chloride: 101 mmol/L (ref 98–111)
Creatinine, Ser: 1.37 mg/dL — ABNORMAL HIGH (ref 0.61–1.24)
GFR, Estimated: 56 mL/min — ABNORMAL LOW (ref >60)
Glucose, Bld: 287 mg/dL — ABNORMAL HIGH (ref 70–99)
Potassium: 2.9 mmol/L — ABNORMAL LOW (ref 3.5–5.1)
Sodium: 133 mmol/L — ABNORMAL LOW (ref 135–145)

## 2020-11-26 LAB — PREPARE RBC (CROSSMATCH)

## 2020-11-26 MED ORDER — LACTATED RINGERS IV BOLUS
1000.0000 mL | Freq: Once | INTRAVENOUS | Status: AC
  Administered 2020-11-26: 1000 mL via INTRAVENOUS

## 2020-11-26 MED ORDER — POTASSIUM CHLORIDE CRYS ER 20 MEQ PO TBCR
40.0000 meq | EXTENDED_RELEASE_TABLET | Freq: Two times a day (BID) | ORAL | Status: AC
  Administered 2020-11-26 (×2): 40 meq via ORAL
  Filled 2020-11-26 (×2): qty 2

## 2020-11-26 MED ORDER — IOHEXOL 350 MG/ML SOLN
100.0000 mL | Freq: Once | INTRAVENOUS | Status: AC | PRN
  Administered 2020-11-26: 100 mL via INTRAVENOUS

## 2020-11-26 MED ORDER — SODIUM CHLORIDE 0.9% IV SOLUTION: Freq: Once | INTRAVENOUS | Status: DC

## 2020-11-26 NOTE — Progress Notes (Signed)
Daily Rounding Note  11/26/2020, 9:48 AM  LOS: 2 days   SUBJECTIVE:   Chief complaint: Hematochezia.  Presumed diverticular bleed.  Blood loss anemia.  3 or 4 episodes of large-volume, painless hematochezia overnight.  Had a syncopal spell without significant trauma after falling.  BPs as low as 120s/80s.  Patient himself feels well.  No nausea, no abdominal pain. Returned from CT angio within the last 9220 minutes but no report yet on findings.  OBJECTIVE:         Vital signs in last 24 hours:    Temp:  [97.6 F (36.4 C)-99 F (37.2 C)] 97.9 F (36.6 C) (01/31 2122) Pulse Rate:  [52-113] 113 (02/01 0518) Resp:  [16-25] 18 (01/31 2122) BP: (124-187)/(75-105) 128/82 (02/01 0518) SpO2:  [100 %] 100 % (01/31 2122) Weight:  [90.3 kg] 90.3 kg (02/01 0245) Last BM Date: 11/24/20 Filed Weights   11/25/20 0023 11/25/20 0500 11/26/20 0245  Weight: 92.4 kg 92.4 kg 90.3 kg   General: Looks well.  Alert, comfortable. Heart: RRR.  No MRG.  S1, S2 present Chest: No labored breathing or cough.  Lungs clear bilaterally Abdomen: Active bowel sounds.  No tenderness or distention. Extremities: No CCE. Neuro/Psych: Oriented x3.  Alert.  Moves all 4 limbs.  No tremors or gross deficits.  Intake/Output from previous day: 01/31 0701 - 02/01 0700 In: 220 [P.O.:100; IV Piggyback:120] Out: 800 [Urine:800]  Intake/Output this shift: No intake/output data recorded.  Lab Results: Recent Labs    11/24/20 1635 11/26/20 0353 11/26/20 0539  WBC 10.0 13.7* 11.7*  HGB 10.4* 8.7* 8.1*  HCT 31.6* 27.4* 24.3*  PLT 250 240 228   BMET Recent Labs    11/23/20 1416 11/24/20 0511 11/26/20 0550  NA 138 138 133*  K 3.8 3.8 2.9*  CL 104 104 101  CO2 24 26 23   GLUCOSE 225* 163* 287*  BUN 15 14 15   CREATININE 1.15 0.97 1.37*  CALCIUM 8.8* 9.2 8.2*   LFT Recent Labs    11/23/20 1646 11/24/20 0511  PROT 6.1* 5.9*  ALBUMIN 3.5 3.3*   AST 19 18  ALT 17 18  ALKPHOS 75 72  BILITOT 0.6 0.4  BILIDIR <0.1  --   IBILI NOT CALCULATED  --    PT/INR No results for input(s): LABPROT, INR in the last 72 hours. Hepatitis Panel No results for input(s): HEPBSAG, HCVAB, HEPAIGM, HEPBIGM in the last 72 hours.  Studies/Results: EEG  Result Date: 11/24/2020 Lora Havens, MD     11/24/2020 12:24 PM Patient Name: Jon Robinson. MRN: [id number] Epilepsy Attending: Lora Havens Referring Physician/Provider: Dr Hayden Rasmussen Date: 11/24/2020 Duration: 27.24 mins Patient history: 89y M with syncope. EEG to evaluate for seizure Level of alertness: Awake, asleep AEDs during EEG study: None Technical aspects: This EEG study was done with scalp electrodes positioned according to the 10-20 International system of electrode placement. Electrical activity was acquired at a sampling rate of 500Hz  and reviewed with a high frequency filter of 70Hz  and a low frequency filter of 1Hz . EEG data were recorded continuously and digitally stored. Description: The posterior dominant rhythm consists of 9-10 Hz activity of moderate voltage (25-35 uV) seen predominantly in posterior head regions, symmetric and reactive to eye opening and eye closing. Sleep was characterized by vertex waves, sleep spindles (12 to 14 Hz), maximal frontocentral region.  Hyperventilation did not show any EEG change.  Physiologic photic driving was not seen  during photic stimulation. IMPRESSION: This study is within normal limits. No seizures or epileptiform discharges were seen throughout the recording. Lora Havens   ECHOCARDIOGRAM COMPLETE  Result Date: 11/24/2020    ECHOCARDIOGRAM REPORT   Patient Name:   Jon Robinson. Date of Exam: 11/24/2020 Medical Rec #:  [id number]       Height:       69.0 in Accession #:    [id number]      Weight:       210.0 lb Date of Birth:  May 06, 1951       BSA:          2.109 m Patient Age:    70 years        BP:           135/93 mmHg Patient Gender:  M               HR:           69 bpm. Exam Location:  Inpatient Procedure: 2D Echo, Cardiac Doppler and Color Doppler Indications:    R55 Syncope  History:        Patient has no prior history of Echocardiogram examinations.                 Risk Factors:Hypertension, Diabetes and GERD.  Sonographer:    Jonelle Sidle Dance Referring Phys: [place]  1. Left ventricular ejection fraction, by estimation, is 50 to 55%. The left ventricle has low normal function. The left ventricle has no regional wall motion abnormalities. There is mild left ventricular hypertrophy. Left ventricular diastolic parameters are consistent with Grade I diastolic dysfunction (impaired relaxation).  2. Right ventricular systolic function is normal. The right ventricular size is normal.  3. Left atrial size was moderately dilated.  4. The mitral valve is normal in structure. No evidence of mitral valve regurgitation. No evidence of mitral stenosis.  5. The aortic valve is calcified. There is mild calcification of the aortic valve. Aortic valve regurgitation is not visualized. Mild aortic valve sclerosis is present, with no evidence of aortic valve stenosis.  6. There is mild dilatation at the level of the sinuses of Valsalva, measuring 40 mm.  7. The inferior vena cava is normal in size with greater than 50% respiratory variability, suggesting right atrial pressure of 3 mmHg. FINDINGS  Left Ventricle: Left ventricular ejection fraction, by estimation, is 50 to 55%. The left ventricle has low normal function. The left ventricle has no regional wall motion abnormalities. The left ventricular internal cavity size was normal in size. There is mild left ventricular hypertrophy. Left ventricular diastolic parameters are consistent with Grade I diastolic dysfunction (impaired relaxation). Right Ventricle: The right ventricular size is normal. No increase in right ventricular wall thickness. Right ventricular systolic function is normal.  Left Atrium: Left atrial size was moderately dilated. Right Atrium: Right atrial size was normal in size. Pericardium: There is no evidence of pericardial effusion. Mitral Valve: The mitral valve is normal in structure. No evidence of mitral valve regurgitation. No evidence of mitral valve stenosis. Tricuspid Valve: The tricuspid valve is normal in structure. Tricuspid valve regurgitation is not demonstrated. No evidence of tricuspid stenosis. Aortic Valve: The aortic valve is calcified. There is mild calcification of the aortic valve. Aortic valve regurgitation is not visualized. Mild aortic valve sclerosis is present, with no evidence of aortic valve stenosis. Pulmonic Valve: The pulmonic valve was normal in structure. Pulmonic valve regurgitation is trivial. No evidence of  pulmonic stenosis. Aorta: The aortic root is normal in size and structure. There is mild dilatation at the level of the sinuses of Valsalva, measuring 40 mm. Venous: The inferior vena cava is normal in size with greater than 50% respiratory variability, suggesting right atrial pressure of 3 mmHg. IAS/Shunts: No atrial level shunt detected by color flow Doppler.  LEFT VENTRICLE PLAX 2D LVIDd:         4.10 cm LVIDs:         3.00 cm LV PW:         1.30 cm LV IVS:        1.20 cm LVOT diam:     2.30 cm LV SV:         103 LV SV Index:   49 LVOT Area:     4.15 cm  RIGHT VENTRICLE          IVC RV Basal diam:  2.70 cm  IVC diam: 2.50 cm TAPSE (M-mode): 2.3 cm LEFT ATRIUM              Index       RIGHT ATRIUM           Index LA diam:        4.10 cm  1.94 cm/m  RA Area:     16.70 cm LA Vol (A2C):   105.0 ml 49.78 ml/m RA Volume:   41.20 ml  19.53 ml/m LA Vol (A4C):   56.3 ml  26.69 ml/m LA Biplane Vol: 77.2 ml  36.60 ml/m  AORTIC VALVE LVOT Vmax:   134.50 cm/s LVOT Vmean:  80.300 cm/s LVOT VTI:    0.247 m  AORTA Ao Root diam: 4.00 cm Ao Asc diam:  3.20 cm MITRAL VALVE MV Area (PHT): 3.17 cm    SHUNTS MV Decel Time: 239 msec    Systemic VTI:  0.25  m MV E velocity: 75.40 cm/s  Systemic Diam: 2.30 cm MV A velocity: 82.70 cm/s MV E/A ratio:  0.91 Candee Furbish MD Electronically signed by Candee Furbish MD Signature Date/Time: 11/24/2020/11:25:36 AM    Final     ASSESMENT:   *    Hematochezia. 11/25/2020: Nonbleeding hemorrhoids.  Blood throughout the colon.  Diverticulosis.  Small rectosigmoid, sigmoid, descending colon polyps not resected.  After lavage, clearance of blood from the colon no active bleeding seen.  Presumed diverticular bleed. Several episodes hematochezia overnight along w syncope.    *    Anemia.  Hgb 11.4 >> 8.1.  MCV normal.  Not iron deficient.  *    Echocardiogram showing LVEF 50 to [id number], grade 1 diastolic dysfunction.  Mild aortic valve calcification/sclerosis without stenosis or regurgitation  *   DM.  *   AKI, mild.  *    Hyponatremia.  *     Hypokalemia.   PLAN   *   Await reading of the CT angio.  Called the reading room and this is going to be set as a stat read.  *    Repeat stat CBC now.    *   Will need repeat colonoscopy in 6 to 12 months to remove the small polyps. PCP at [facility] can refer back to [facility], Dr. Rush Landmark or choose in-system colonoscopy.    Azucena Freed  11/26/2020, 9:48 AM Phone [phone]

## 2020-11-26 NOTE — Significant Event (Signed)
Rapid Response Event Note   Reason for Call :  Syncopal episode on toilet after have BRBPR.  Per RN, pt was in bathroom on toilet, and had a syncopal episode. While trying to get pt back to bed, pt had another syncopal episode in wheelchair in room.  Initial Focused Assessment:  Pt sitting in wheelchair asking to be put back in bed. After getting pt back to bed, pt assessed. Pt alert and oriented, denies headache, chest pain, and SOB. Pt moves all extremities equally, follows all commands, and answer questions appropriately. Skin cool, clammy, diaphoretic.   Once back in bed, pt had another episode of BRBPR- clots were present.   EKG rhythm reviewed. Pt had an episode of tachycardia just prior to each syncopal episode.   HR-98, SBP-140s, RR-18, SpO2-100% on RA  Interventions:  1L LR EKG-NSR with PVCs New PIV CTA ABD/Pelvis H/H Plan of Care:  ??Hypotensive event during BM/while OOB. LR bolus infusing. Obtain CT. Await lab/CT results and notify MD of abnormalities. Continue to monitor pt closely. Call RRT if further assistance needed.   Event Summary:   MD Notified: Dr. Allyson Sabal notified and came to bedside Call [place] End Time:  Dillard Essex, RN

## 2020-11-26 NOTE — Progress Notes (Addendum)
Subjective:   Overnight, patient had 2 syncopal episodes, 1 after having a large bloody bowel movement, and 2nd after going from wheelchair to bed. Seen at bedside by NF, 1 L LR, and sent for CTA abdomen.   Seen at bedside with GI this morning. Discussed that we are awaiting CTA results. No further hematachezia since early this morning. Denies dizziness, lightheadedness, or chest pain. States that he alternates between constipation and diarrhea and that he is currently in "diarrhea phase." He states that the amount of stool he had is his normal during these diarrhea episodes.   Objective:  Vital signs in last 24 hours: Vitals:   11/25/20 1611 11/25/20 2122 11/26/20 0245 11/26/20 0518  BP: (!) 128/93 (!) 167/96  128/82  Pulse: 99 (!) 52  (!) 113  Resp: 20 18    Temp: 98.7 F (37.1 C) 97.9 F (36.6 C)    TempSrc: Oral Oral    SpO2: 100% 100%    Weight:   90.3 kg   Height:       Physical Exam Constitutional:      General: He is not in acute distress.    Appearance: He is not ill-appearing.  Cardiovascular:     Rate and Rhythm: Normal rate and regular rhythm.     Pulses: Normal pulses.     Heart sounds: Normal heart sounds. No murmur heard. No friction rub. No gallop.   Pulmonary:     Effort: Pulmonary effort is normal. No respiratory distress.     Breath sounds: Normal breath sounds. No wheezing, rhonchi or rales.  Abdominal:     General: Abdomen is flat. Bowel sounds are normal.     Palpations: Abdomen is soft.     Tenderness: There is no abdominal tenderness. There is no guarding.  Musculoskeletal:     Right lower leg: No edema.     Left lower leg: No edema.  Skin:    General: Skin is warm.     Findings: No rash.  Neurological:     Mental Status: He is alert.     Intake/Output Summary (Last 24 hours) at 11/26/2020 0752 Last data filed at 11/26/2020 0359 Gross per 24 hour  Intake 220 ml  Output 800 ml  Net -580 ml   Filed Weights   11/25/20 0023 11/25/20 0500  11/26/20 0245  Weight: 92.4 kg 92.4 kg 90.3 kg  Labs in last 24 hours: CBC Latest Ref Rng & Units 11/26/2020 11/26/2020 11/24/2020  WBC 4.0 - 10.5 K/uL 11.7(H) 13.7(H) 10.0  Hemoglobin 13.0 - 17.0 g/dL 8.1(L) 8.7(L) 10.4(L)  Hematocrit 39.0 - 52.0 % 24.3(L) 27.4(L) 31.6(L)  Platelets 150 - 400 K/uL 228 240 250    Assessment/Plan:  Active Problems:   Syncope and collapse  Mr. Woodley Petzold is a 70 year old male with past medical history significant for HTN, T2DM and GERD who presented to [facility] on 01/29 for evaluation of syncope in the setting of hematochezia and THC consumption.  #Syncope  # Acute Lower GI Bleed Patient had two more episodes of syncope overnight associated with large volume BRBPR. S/p Colonoscopy yesterday with pan diverticulosis and blood throughout the colon but no source identified. Had CTA done this morning after recurrent bleeding episodes, again no source identified. He continues to have bloody BM. Needs to be closely monitored, strict bed rest. Appreciate GI's assistance. Hgb has downtrended 11.4 -> 7.5. Fortunately he has remained hemodynamically stable aside from intermittent tachycardia.  - Strict bed rest - Tagged RBC  scan if any further bleeding  - Giving 1U PRBC with 2 hour post transfusion h/h -Q8h CBCs until stable -Continue holding aspirin -Continue SCDs   #AKI In Setting of Hematochezia Hypokalemia:  Patient with hypokalemia and an elevated sCr of 1.37 from 0.97. Etiology likely due to his continued during his large bout of hematochezia. Currently undergoing fluid resuscitation and potassium replenishment.  - Trend BMP - Undergoing 1LR bolus - KLOR-CON 40 mEq BID for 2 doses  #Mild aortic root dilation, active -Continue following with cardiology in outpatient setting  #HTN, chronic - HCTZ 25mg  nightly  #T2DM, chronic Hemoglobin of 7.9 on admission. Blood glucose readings within 100s over past 24 hours. -Continue sensitive SSI  #VTE ppx:  SCDs #IVF: None #Diet: NPO #Code status: Full code  Maudie Mercury, MD 11/26/2020, 7:52 AM Pager: [phone] After 5pm on weekdays and 1pm on weekends: On Call Pager: [phone]

## 2020-11-26 NOTE — Progress Notes (Addendum)
Paged by RN that patient had 2 syncopal episodes, one following a BM with large volume bright red blood per rectum and the other while in wheelchair on the way to bed. Came to examine patient, reports feeling fine but does not remember syncopal episodes. Patient is asymptomatic. Hgb on CBC around 0330 was 8.7, but patient had this episode of BRBPR afterwards. Will repeat STAT CBC, administer 1L LR bolus, and ordered STAT CT angio abdomen pelvis to further assess GI bleed. Will also obtain STAT EKG to rule out cardiogenic cause for syncope. Will obtain BMP to assess electrolytes.

## 2020-11-26 NOTE — Progress Notes (Addendum)
Pt HR runs high  Upon movement, as per him when he gets upset he'll get excited, every time we ask him a question , noted that he turn agitated and he stated  '' I need to calm because all my emotion will get HIGH and I will get excited". Pt also stated that he is passing gas and constipated as he tries to use the potty. Educated that he just had a colonoscopy done.  Also noted that when he talks to the phone with somebody, HR shoots UP.    HR drops to 110s whem calm. As far, PT said He is OK. Will monitor.  As per pt, he's been moving around, used the potty and went to the bathroom, Had 2x Bloody BM.  Encourage to lay down and relax and rest.

## 2020-11-26 NOTE — Progress Notes (Signed)
Found pt on the toilet, having a syncopal episode, sweating and eventually unresponsive with pulse. Pt has been out for couple of minutes, came back alert with slight confusion. VS was stable. Pt stood up to sit on the wheelchair.. Just about to get him to bed, pt had another syncopal episode while on the wheelchair. Rapid  Nurse was called same with the Internal meds. CBG 265.

## 2020-11-27 DIAGNOSIS — D62 Acute posthemorrhagic anemia: Secondary | ICD-10-CM

## 2020-11-27 LAB — TYPE AND SCREEN
ABO/RH(D): O POS
Antibody Screen: NEGATIVE
Unit division: 0

## 2020-11-27 LAB — CBC
HCT: 25.1 % — ABNORMAL LOW (ref 39.0–52.0)
Hemoglobin: 8.4 g/dL — ABNORMAL LOW (ref 13.0–17.0)
MCH: 28.3 pg (ref 26.0–34.0)
MCHC: 33.5 g/dL (ref 30.0–36.0)
MCV: 84.5 fL (ref 80.0–100.0)
Platelets: 227 10*3/uL (ref 150–400)
RBC: 2.97 MIL/uL — ABNORMAL LOW (ref 4.22–5.81)
RDW: 16.6 % — ABNORMAL HIGH (ref 11.5–15.5)
WBC: 14 10*3/uL — ABNORMAL HIGH (ref 4.0–10.5)
nRBC: 0.3 % — ABNORMAL HIGH (ref 0.0–0.2)

## 2020-11-27 LAB — BASIC METABOLIC PANEL
Anion gap: 9 (ref 5–15)
BUN: 12 mg/dL (ref 8–23)
CO2: 26 mmol/L (ref 22–32)
Calcium: 8.4 mg/dL — ABNORMAL LOW (ref 8.9–10.3)
Chloride: 101 mmol/L (ref 98–111)
Creatinine, Ser: 1.18 mg/dL (ref 0.61–1.24)
GFR, Estimated: >60 mL/min (ref >60)
Glucose, Bld: 179 mg/dL — ABNORMAL HIGH (ref 70–99)
Potassium: 3.9 mmol/L (ref 3.5–5.1)
Sodium: 136 mmol/L (ref 135–145)

## 2020-11-27 LAB — BPAM RBC
Blood Product Expiration Date: 202203032359
ISSUE DATE / TIME: 202202011558
Unit Type and Rh: 5100

## 2020-11-27 LAB — GLUCOSE, CAPILLARY
Glucose-Capillary: 174 mg/dL — ABNORMAL HIGH (ref 70–99)
Glucose-Capillary: 135 mg/dL — ABNORMAL HIGH (ref 70–99)
Glucose-Capillary: 131 mg/dL — ABNORMAL HIGH (ref 70–99)
Glucose-Capillary: 158 mg/dL — ABNORMAL HIGH (ref 70–99)

## 2020-11-27 MED ORDER — INSULIN ASPART 100 UNIT/ML ~~LOC~~ SOLN
0.0000 [IU] | Freq: Three times a day (TID) | SUBCUTANEOUS | Status: DC
  Administered 2020-11-28: 2 [IU] via SUBCUTANEOUS

## 2020-11-27 NOTE — Progress Notes (Signed)
Daily Rounding Note  11/27/2020, 1:05 PM  LOS: 3 days   SUBJECTIVE:   Chief complaint: Diverticular bleed     Just before shift change today so probably about 6 AM, he had another burgundy-looking loose/clotted stool the consistency of applesauce.  Passing flatus.  He feels well.  Not dizzy when he gets up. Eating, enjoying solid foods and eating 100% of his meals.  OBJECTIVE:         Vital signs in last 24 hours:    Temp:  [98.2 F (36.8 C)-99.4 F (37.4 C)] 98.6 F (37 C) (02/02 1139) Pulse Rate:  [70-90] 79 (02/02 1139) Resp:  [15-20] 20 (02/02 1139) BP: (119-147)/(68-86) 135/81 (02/02 1139) SpO2:  [100 %] 100 % (02/02 1139) Last BM Date: 11/26/20 Filed Weights   11/25/20 0023 11/25/20 0500 11/26/20 0245  Weight: 92.4 kg 92.4 kg 90.3 kg   General: Looks well, alert, comfortable Heart: RRR Chest: No labored breathing Abdomen: Soft, nontender, nondistended Extremities: No CCE Neuro/Psych: Fully alert and oriented.  Able to walk unassisted within the room.  Intake/Output from previous day: 02/01 0701 - 02/02 0700 In: 1278 [P.O.:597; Blood:681] Out: 700 [Urine:700]  Intake/Output this shift: Total I/O In: 3 [I.V.:3] Out: 275 [Urine:275]  Lab Results: Recent Labs    11/26/20 1012 11/26/20 2157 11/27/20 0627  WBC 13.0* 13.7* 14.0*  HGB 7.5* 7.9* 8.4*  HCT 23.9* 23.8* 25.1*  PLT 225 198 227   BMET Recent Labs    11/26/20 0550 11/27/20 0627  NA 133* 136  K 2.9* 3.9  CL 101 101  CO2 23 26  GLUCOSE 287* 179*  BUN 15 12  CREATININE 1.37* 1.18  CALCIUM 8.2* 8.4*   LFT No results for input(s): PROT, ALBUMIN, AST, ALT, ALKPHOS, BILITOT, BILIDIR, IBILI in the last 72 hours. PT/INR No results for input(s): LABPROT, INR in the last 72 hours. Hepatitis Panel No results for input(s): HEPBSAG, HCVAB, HEPAIGM, HEPBIGM in the last 72 hours.  Studies/Results: CT Angio Abd/Pel w/ and/or w/o  Result  Date: 11/26/2020 CLINICAL DATA:  Rectal bleeding that began past Saturday. EXAM: CTA ABDOMEN AND PELVIS WITHOUT AND WITH CONTRAST TECHNIQUE: Multidetector CT imaging of the abdomen and pelvis was performed using the standard protocol during bolus administration of intravenous contrast. Multiplanar reconstructed images and MIPs were obtained and reviewed to evaluate the vascular anatomy. CONTRAST:  198mL OMNIPAQUE IOHEXOL 350 MG/ML SOLN COMPARISON:  None. FINDINGS: VASCULAR Aorta: Mild scattered atherosclerotic calcifications without aneurysmal dilatation or flow-limiting stenosis. Celiac: Patent without evidence of aneurysm, dissection, vasculitis or significant stenosis. SMA: Patent without evidence of aneurysm, dissection, vasculitis or significant stenosis. Renals: Both renal arteries are patent without evidence of aneurysm, dissection, vasculitis, fibromuscular dysplasia or significant stenosis. IMA: Patent without evidence of aneurysm, dissection, vasculitis or significant stenosis. Inflow: Patent without evidence of aneurysm, dissection, vasculitis or significant stenosis. Proximal Outflow: Bilateral common femoral and visualized portions of the superficial and profunda femoral arteries are patent without evidence of aneurysm, dissection, vasculitis or significant stenosis. Veins: Portal, super mesenteric, and hepatic veins are patent. Review of the MIP images confirms the above findings. NON-VASCULAR Lower chest: No acute abnormality. Diffuse density of the blood pool on the noncontrast images relative to the myocardium suggestive of anemia. Hepatobiliary: No focal liver abnormality is seen. No gallstones, gallbladder wall thickening, or biliary dilatation. Pancreas: Unremarkable. No pancreatic ductal dilatation or surrounding inflammatory changes. Spleen: Normal in size without focal abnormality. Adrenals/Urinary Tract: The adrenal glands  are thickened indicative of hyperplasia. There is a 14 mm low-density  nodule in the medial limb of the right adrenal gland consistent with an adenoma. 1.5 cm simple cyst present the upper pole of the left kidney. 1 mm nonobstructing calculus in the upper pole of the right kidney (71/8) mild left pelvicaliectasis without significant dilatation of the renal pelvis or ureter. Ureters are unremarkable. No significant abnormality of the bladder Stomach/Bowel: No bowel dilatation to indicate ileus or obstruction. There is diffuse colonic diverticulosis without evidence of acute diverticulitis. No active extravasation is identified. Appendix is normal. Lymphatic: No enlarged abdominal or pelvic lymph nodes. Reproductive: Mildly enlarged prostate. Other: Small fat containing umbilical and right inguinal hernias. Musculoskeletal: Advanced degenerative changes of the facets of the lumbar spine. IMPRESSION: VASCULAR 1. No significant abnormality of the mesenteric arteries or veins. NON-VASCULAR 1. No extravasation or delayed pooling of contrast identified within the stomach or bowel to indicate active GI bleed. 2. Diffuse colonic diverticulosis without evidence of acute diverticulitis. Electronically Signed   By: Miachel Roux M.D.   On: 11/26/2020 10:54    ASSESMENT:   *    Diverticular bleed.  Large volume hematochezia.  *      Blood loss anemia.   PLAN   *    Observe at least 1 more night.  CBC in the morning    Jon Robinson  11/27/2020, 1:05 PM Phone [phone]

## 2020-11-27 NOTE — Progress Notes (Signed)
Chaplain responded to nurse call at 555 AM.  Pt emotional after having difficulty using the bedpan.  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 It's not like me to be this person."  When chaplain asked if this was his first hospitalization, he said yes. He was tearful, talking about how his mother raised him. He shopped for his family at age 70.   "I was not a mama's boy. 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  Chaplain acknowledged his frustrations with his current situation and encouraged him to have patience with the staff, who were doing their best, and more importantly, patience with himself. "I never thought of that before!"  They discussed the Serenity Prayer. "God grant me the serenity to accept the things the things I cannot change, and the wisdom to change the things that I can, and the wisdom to know the difference."  Patient knew it by heart as well, and thanked the chaplain for helping him. This chaplain will refer patient to daytime chaplain for follow-up. Rev. Tamsen Snider Pager [phone]

## 2020-11-27 NOTE — Evaluation (Signed)
Physical Therapy Evaluation & Discharge  Patient Details Name: Jon Robinson. MRN: [id number] DOB: [birth year] Today's Date: 11/27/2020   History of Present Illness  70yo male with PMH of HTN, nephrolithiasis, GERD, and type II diabetes presenting after two syncopal events and bright read watery stools. Patient found to have GI bleed. Patient had 2 syncopal episodes while in hospital, 1 while having BM and another getting back into bed from w/c.  Clinical Impression  PTA, patient lives alone and is independent with mobility in the home and uses cane for community distances. Patient overall modI for mobility with no AD. Patient denies dizziness with mobility this session. Patient does not require skilled PT services during acute stay. No PT follow up recommended at this time.     Follow Up Recommendations No PT follow up    Equipment Recommendations  None recommended by PT    Recommendations for Other Services       Precautions / Restrictions Precautions Precautions: Fall Restrictions Weight Bearing Restrictions: No      Mobility  Bed Mobility Overal bed mobility: Modified Independent                  Transfers Overall transfer level: Modified independent Equipment used: None                Ambulation/Gait Ambulation/Gait assistance: Modified independent (Device/Increase time) Gait Distance (Feet): 300 Feet Assistive device: None Gait Pattern/deviations: Step-through pattern     General Gait Details: no overt LOB noted  Stairs            Wheelchair Mobility    Modified Rankin (Stroke Patients Only)       Balance Overall balance assessment: Modified Independent                                           Pertinent Vitals/Pain Pain Assessment: No/denies pain    Home Living Family/patient expects to be discharged to:: Private residence Living Arrangements: Alone Available Help at Discharge: Friend(s);Available  PRN/intermittently Type of Home: Apartment Home Access: Level entry     Home Layout: One level Home Equipment: Bedside commode;Cane - single point;Jon Robinson - 2 wheels      Prior Function Level of Independence: Independent with assistive device(s)         Comments: uses SPC for community distances     Hand Dominance        Extremity/Trunk Assessment   Upper Extremity Assessment Upper Extremity Assessment: Overall WFL for tasks assessed    Lower Extremity Assessment Lower Extremity Assessment: Overall WFL for tasks assessed       Communication   Communication: No difficulties  Cognition Arousal/Alertness: Awake/alert Behavior During Therapy: WFL for tasks assessed/performed Overall Cognitive Status: Within Functional Limits for tasks assessed                                        General Comments      Exercises     Assessment/Plan    PT Assessment Patent does not need any further PT services  PT Problem List         PT Treatment Interventions      PT Goals (Current goals can be found in the Care Plan section)  Acute Rehab PT Goals Patient Stated Goal: to go  home PT Goal Formulation: With patient Potential to Achieve Goals: Good    Frequency     Barriers to discharge        Co-evaluation               AM-PAC PT "6 Clicks" Mobility  Outcome Measure Help needed turning from your back to your side while in a flat bed without using bedrails?: None Help needed moving from lying on your back to sitting on the side of a flat bed without using bedrails?: None Help needed moving to and from a bed to a chair (including a wheelchair)?: None Help needed standing up from a chair using your arms (e.g., wheelchair or bedside chair)?: None Help needed to walk in hospital room?: None Help needed climbing 3-5 steps with a railing? : None 6 Click Score: 24    End of Session Equipment Utilized During Treatment: Gait belt Activity  Tolerance: Patient tolerated treatment well Patient left: in bed;with call bell/phone within reach Nurse Communication: Mobility status PT Visit Diagnosis: Unsteadiness on feet (R26.81)    Time: 7903-8333 PT Time Calculation (min) (ACUTE ONLY): 17 min   Charges:   PT Evaluation $PT Eval Low Complexity: 1 Low          Jon Robinson PT, DPT Acute Rehabilitation Services Pager [phone] Office [phone]   Alda Lea 11/27/2020, 5:18 PM

## 2020-11-27 NOTE — Progress Notes (Signed)
Pt having a BM and he gets so agitated as he started wiping himself while this nurse assisting him. He keeps on Cursing and noted that he gets frustrated and wanted to use the bathroom, however, pt still complaint on bedrest. Right after cleaning him up, he started to cry,  And been emotional. Paged Chaplain as pt requested it.

## 2020-11-27 NOTE — Progress Notes (Signed)
Subjective:   No acute events overnight.   Seen at bedside this AM. No complaints this morning. He's feeling well, but was frustrated because he had a large, firm, bowel movement that overflowed the bedpan, and he would like to be able to get out of bed. We discussed checking orthostatics before letting him get up. Patient was fine with the plan. All questions and concerns addressed at bedside.    Objective:  Vital signs in last 24 hours: Vitals:   11/26/20 1600 11/26/20 1620 11/26/20 1908 11/27/20 0525  BP: 119/68 136/81 138/77 (!) 147/83  Pulse: 88 90 70 79  Resp: 17 18 20 15   Temp: 99.4 F (37.4 C) 99.3 F (37.4 C) 99.1 F (37.3 C) 98.2 F (36.8 C)  TempSrc: Oral Oral Oral Oral  SpO2: 100% 100% 100% 100%  Weight:      Height:       Orthostatic VS for the past 24 hrs:  BP- Lying Pulse- Lying BP- Sitting Pulse- Sitting BP- Standing at 0 minutes Pulse- Standing at 0 minutes  11/27/20 0904 140/84 69 126/79 94 (!) 150/94 98   Physical Exam Constitutional:      General: He is not in acute distress.    Appearance: Normal appearance. He is not ill-appearing.  Cardiovascular:     Rate and Rhythm: Normal rate and regular rhythm.     Pulses: Normal pulses.     Heart sounds: Normal heart sounds. No murmur heard. No friction rub. No gallop.   Pulmonary:     Effort: Pulmonary effort is normal.     Breath sounds: Normal breath sounds. No wheezing, rhonchi or rales.  Abdominal:     General: Abdomen is flat. Bowel sounds are normal. There is no distension.     Palpations: Abdomen is soft.     Tenderness: There is no abdominal tenderness. There is no guarding.  Neurological:     Mental Status: He is alert and oriented to person, place, and time.  Psychiatric:        Mood and Affect: Mood normal.        Behavior: Behavior normal.       Intake/Output Summary (Last 24 hours) at 11/27/2020 0610 Last data filed at 11/27/2020 0200 Gross per 24 hour  Intake 1278 ml  Output 700 ml   Net 578 ml   Filed Weights   11/25/20 0023 11/25/20 0500 11/26/20 0245  Weight: 92.4 kg 92.4 kg 90.3 kg  Labs in last 24 hours: CBC Latest Ref Rng & Units 11/26/2020 11/26/2020 11/26/2020  WBC 4.0 - 10.5 K/uL 13.7(H) 13.0(H) 11.7(H)  Hemoglobin 13.0 - 17.0 g/dL 7.9(L) 7.5(L) 8.1(L)  Hematocrit 39.0 - 52.0 % 23.8(L) 23.9(L) 24.3(L)  Platelets 150 - 400 K/uL 198 225 228    Assessment/Plan:  Principal Problem:   Gastrointestinal hemorrhage associated with intestinal diverticulosis Active Problems:   Syncope and collapse  [name] is a 70 year old male with past medical history significant for HTN, T2DM and GERD who presented to [facility] on 01/29 for evaluation of syncope in the setting of hematochezia and THC consumption.  #Syncope secondary to acute blood loss -no evidence of orthostasis on vitals this morning # Acute Lower GI Bleed -no further GI bleeding noted overnight, recent Hgb of 8.4 # Acute on chronic normocytic anemia -s/p 1U PRBC 2/1 -hgb 8.4 this morning. # Reactive leukocytosis secondary to GIB Plan -will check another hemoglobin tonight and tomorrow morning. Suspect he will be stable for discharge tomorrow if  no further bleeding and hemoglobin remains stable -if he does rebleed, obtain Tagged RBC scan -continue holding antiplatelet agents and AC -d/c bedrest. Ok to ambulate with assistance. I have ordered PT to assess for safe discharge  #AKI resolved  #Mild aortic root dilation, active. Continue following with cardiology in outpatient setting #HTN, chronic. Blood pressures stable. Continue HCTZ 25mg  nightly #T2DM, chronic. CBGs at goal. Continue SSI. Adjust as needed.  #VTE ppx: SCDs #IVF: None #Diet: HH #Code status: Full code  Maudie Mercury, MD 11/27/2020, 6:10 AM Pager: [phone] After 5pm on weekdays and 1pm on weekends: On Call Pager: [phone]

## 2020-11-28 ENCOUNTER — Encounter: Payer: Self-pay | Admitting: Gastroenterology

## 2020-11-28 DIAGNOSIS — N179 Acute kidney failure, unspecified: Secondary | ICD-10-CM

## 2020-11-28 LAB — CBC
HCT: 24.6 % — ABNORMAL LOW (ref 39.0–52.0)
HCT: 25.9 % — ABNORMAL LOW (ref 39.0–52.0)
Hemoglobin: 7.8 g/dL — ABNORMAL LOW (ref 13.0–17.0)
Hemoglobin: 8.6 g/dL — ABNORMAL LOW (ref 13.0–17.0)
MCH: 27.4 pg (ref 26.0–34.0)
MCH: 28.2 pg (ref 26.0–34.0)
MCHC: 31.7 g/dL (ref 30.0–36.0)
MCHC: 33.2 g/dL (ref 30.0–36.0)
MCV: 86.3 fL (ref 80.0–100.0)
MCV: 84.9 fL (ref 80.0–100.0)
Platelets: 235 10*3/uL (ref 150–400)
Platelets: 263 10*3/uL (ref 150–400)
RBC: 2.85 MIL/uL — ABNORMAL LOW (ref 4.22–5.81)
RBC: 3.05 MIL/uL — ABNORMAL LOW (ref 4.22–5.81)
RDW: 16.7 % — ABNORMAL HIGH (ref 11.5–15.5)
RDW: 17.2 % — ABNORMAL HIGH (ref 11.5–15.5)
WBC: 11.5 10*3/uL — ABNORMAL HIGH (ref 4.0–10.5)
WBC: 10.9 10*3/uL — ABNORMAL HIGH (ref 4.0–10.5)
nRBC: 0 % (ref 0.0–0.2)
nRBC: 0.2 % (ref 0.0–0.2)

## 2020-11-28 LAB — GLUCOSE, CAPILLARY
Glucose-Capillary: 177 mg/dL — ABNORMAL HIGH (ref 70–99)
Glucose-Capillary: 120 mg/dL — ABNORMAL HIGH (ref 70–99)

## 2020-11-28 NOTE — Plan of Care (Signed)
  Problem: Education: Goal: Knowledge of condition and prescribed therapy will improve Outcome: Adequate for Discharge

## 2020-11-28 NOTE — Discharge Summary (Signed)
Name: Jon Robinson. MRN: [id number] DOB: November 20, 1950 70 y.o. PCP: Donita Brooks, MD  Date of Admission: 11/23/2020  2:01 PM Date of Discharge:  Attending Physician: Velna Ochs, MD  Subjective: Today, Jon Robinson. states that he is doing very well and has actually been able to ambulate in the hallway without dizziness, lightheadedness, sensation of presyncope.  He states that the last bowel movement he had was actually formed and brown however prior to that his other 2 bowel movement had little streaks of blood.    Discharge Diagnosis: 1.Syncope secondary to acute blood loss, Acute Lower GI Bleed, Acute on chronic normocytic anemia, painless hematochezia due to presumed diverticular bleed 2.  AKI 3.  Hypertension 4.  Diabetes mellitus  Discharge Medications: Allergies as of 11/28/2020      Reactions   Novocain [procaine] Swelling   In the face   Penicillins Other (See Comments)   Childhood reaction      Medication List    STOP taking these medications   aspirin 81 MG chewable tablet   meloxicam 15 MG tablet Commonly known as: MOBIC     TAKE these medications   acetaminophen 325 MG tablet Commonly known as: TYLENOL Take 325 mg by mouth every 6 (six) hours as needed (pain).   atorvastatin 20 MG tablet Commonly known as: LIPITOR Take 20 mg by mouth daily.   diclofenac Sodium 1 % Gel Commonly known as: VOLTAREN Apply 1 application topically 3 (three) times daily as needed (left thumb pain).   Gas-X 80 MG chewable tablet Generic drug: simethicone Chew 80 mg by mouth at bedtime.   hydrochlorothiazide 25 MG tablet Commonly known as: HYDRODIURIL Take 25 mg by mouth daily.   loratadine 10 MG tablet Commonly known as: CLARITIN Take 10 mg by mouth daily.   metFORMIN 500 MG tablet Commonly known as: GLUCOPHAGE Take 500 mg by mouth 3 (three) times daily.   polyvinyl alcohol 1.4 % ophthalmic solution Commonly known as: LIQUIFILM TEARS Place 1 drop  into both eyes 3 (three) times daily.   senna-docusate 8.6-50 MG tablet Commonly known as: Senokot-S Take 1 tablet by mouth daily.       Disposition and follow-up:   Jon Robinson. was discharged from [facility] in Stable condition.  At the hospital follow up visit please address:  1.Syncope secondary to acute blood loss, Acute Lower GI Bleed, Acute on chronic normocytic anemia, painless hematochezia due to presumed diverticular bleed: Repeat CBC in 10 to 14 days, follow-up with GI and repeat colonoscopy outpatient  2.  Labs / imaging needed at time of follow-up: CBC  3.  Pending labs/ test needing follow-up: None  Follow-up Appointments:   Hospital Course by problem list: [name] is a 70 year old male with past medical history significant for HTN, T2DM and GERD who presented to [facility] on 01/29 for evaluation of syncope in the setting of hematochezia and THC consumption.  1.Syncope secondary to acute blood loss, Acute Lower GI Bleed, Acute on chronic normocytic anemia, painless hematochezia due to presumed diverticular bleed Mr. [name] is a 70 year old male with past medical history significant for HTN, T2DM and GERD who presented to [facility] on 01/29 for evaluation of syncope in the setting of hematochezia.  On presentation to the hospital, his hemoglobin was 11.4 however this trended down with range of 7-8 after he was noted to have several episodes of large volume hematochezia.  He was found to have several more episodes  of syncope with associated hematochezia.  He underwent a colonoscopy which showed diffuse diverticulosis and blood throughout the colon with no obvious source of bleeding.  He also had a CT angiography abdomen and pelvis performed which also reviewed diffuse colonic diverticulosis with no evidence of extravasation on delayed pulling of contrast that would indicate active GI bleed.  He was transfused 1 unit PRBC on November 26, 2020.  His  hemoglobin was stable at 8.6 on the day of discharge.  2.AKI, resolved: Likely in the setting of anemia however this improved.  3.Mild aortic root dilation, active. Continue following with cardiology in outpatient setting  4.HTN, chronic. Blood pressures stable.   Was treated with HCTZ 25mg  nightly  5.T2DM, chronic.  Was placed on SSI  Discharge Exam:   BP (!) 152/88 (BP Location: Right Arm)   Pulse 66   Temp 98.6 F (37 C) (Oral)   Resp 19   Ht 5\' 9"  (1.753 m)   Wt 90.7 kg   SpO2 100%   BMI 29.52 kg/m  Discharge exam:  Const: In no apparent distress, lying comfortably in bed, conversational HEENT: Atraumatic, normocephalic Resp: CTA BL, no wheezes, crackles, rhonchi CV: RRR, no murmurs, gallop, rub Abd: Bowel sounds present, nondistended, nontender to palpation  Pertinent Labs, Studies, and Procedures:  CBC Latest Ref Rng & Units 11/28/2020 11/28/2020 11/27/2020  WBC 4.0 - 10.5 K/uL 10.9(H) 11.5(H) 14.0(H)  Hemoglobin 13.0 - 17.0 g/dL 8.6(L) 7.8(L) 8.4(L)  Hematocrit 39.0 - 52.0 % 25.9(L) 24.6(L) 25.1(L)  Platelets 150 - 400 K/uL 263 235 227   CMP Latest Ref Rng & Units 11/27/2020 11/26/2020 11/24/2020  Glucose 70 - 99 mg/dL 179(H) 287(H) 163(H)  BUN 8 - 23 mg/dL 12 15 14   Creatinine 0.61 - 1.24 mg/dL 1.18 1.37(H) 0.97  Sodium 135 - 145 mmol/L 136 133(L) 138  Potassium 3.5 - 5.1 mmol/L 3.9 2.9(L) 3.8  Chloride 98 - 111 mmol/L 101 101 104  CO2 22 - 32 mmol/L 26 23 26   Calcium 8.9 - 10.3 mg/dL 8.4(L) 8.2(L) 9.2  Total Protein 6.5 - 8.1 g/dL - - 5.9(L)  Total Bilirubin 0.3 - 1.2 mg/dL - - 0.4  Alkaline Phos 38 - 126 U/L - - 72  AST 15 - 41 U/L - - 18  ALT 0 - 44 U/L - - 18   TTE 11/24/2020: IMPRESSIONS  1. Left ventricular ejection fraction, by estimation, is 50 to 55%. The  left ventricle has low normal function. The left ventricle has no regional  wall motion abnormalities. There is mild left ventricular hypertrophy.  Left ventricular diastolic  parameters are  consistent with Grade I diastolic dysfunction (impaired  relaxation).  2. Right ventricular systolic function is normal. The right ventricular  size is normal.  3. Left atrial size was moderately dilated.  4. The mitral valve is normal in structure. No evidence of mitral valve  regurgitation. No evidence of mitral stenosis.  5. The aortic valve is calcified. There is mild calcification of the  aortic valve. Aortic valve regurgitation is not visualized. Mild aortic  valve sclerosis is present, with no evidence of aortic valve stenosis.  6. There is mild dilatation at the level of the sinuses of Valsalva,  measuring 40 mm.  7. The inferior vena cava is normal in size with greater than 50%  respiratory variability, suggesting right atrial pressure of 3 mmHg.    Colonoscopy: 11/25/2020:  - Hemorrhoids found on digital rectal exam. - The examined portion of the ileum was  normal. - Blood in the entire examined colon. - Diverticulosis. - A few 2 to 5 mm polyps at the recto-sigmoid colon, in the sigmoid colon and in the descending colon. Resection not attempted. - Non-bleeding non-thrombosed external and internal hemorrhoids. - I evaluated and intubated the cecum on 6 occasions during the washing/clearance phases of the procedure from the rectum to cecum to evaluate for any active source of bleeding, but non were found on today's examination. - Etiology of presentation is from diverticular hemorrhage.  Discharge Instructions: Discharge Instructions    Diet - low sodium heart healthy   Complete by: As directed    Discharge instructions   Complete by: As directed    Mr. Hinshaw Junior,   It was a pleasure seeing you here in the hospital.  You were admitted because of low blood count from bleeding from your bowel.  He had a colonoscopy which showed diverticulosis.  Your hemoglobin is now stable.  Here my recommendations  1.  Please hold your aspirin and have your blood count check in  10 to 14 days by your primary care doctor.   2.  Follow-up with the GI doctor 3.  Discontinue your bowel regimen and prevent constipation as much as possible.   Increase activity slowly   Complete by: As directed       Signed: Jean Rosenthal, MD 11/28/2020, 1:29 PM   Pager: [phone] Internal Medicine Teaching Service

## 2020-11-28 NOTE — Progress Notes (Signed)
          Daily Rounding Note  11/28/2020, 12:07 PM  LOS: 4 days   SUBJECTIVE:   Chief complaint: painless hematochezia, divertic bleeding.       2 BM's this Am: brown, formed.  No blood Heart rate, blood pressure stable.  Excellent saturations on room air.  OBJECTIVE:         Vital signs in last 24 hours:    Temp:  [98.2 F (36.8 C)-98.9 F (37.2 C)] 98.6 F (37 C) (02/03 1134) Pulse Rate:  [66-88] 66 (02/03 1134) Resp:  [18-20] 19 (02/03 1134) BP: (116-156)/(63-96) 152/88 (02/03 1134) SpO2:  [94 %-100 %] 100 % (02/03 1134) Weight:  [90.7 kg] 90.7 kg (02/03 0347) Last BM Date: 11/27/20 Filed Weights   11/25/20 0500 11/26/20 0245 11/28/20 0347  Weight: 92.4 kg 90.3 kg 90.7 kg   General: looks well   Heart: RRR Chest: clear bil.   Abdomen: soft, NT, ND.  No mass or HSM  Extremities: no CCE Neuro/Psych:  Oriented x 3.  Fluid speech.  No weakness, no tremors.    Intake/Output from previous day: 02/02 0701 - 02/03 0700 In: 483 [P.O.:480; I.V.:3] Out: 1575 [Urine:1575]  Intake/Output this shift: Total I/O In: 594 [P.O.:594] Out: 225 [Urine:225]  Lab Results: Recent Labs    11/26/20 2157 11/27/20 0627 11/28/20 0252  WBC 13.7* 14.0* 11.5*  HGB 7.9* 8.4* 7.8*  HCT 23.8* 25.1* 24.6*  PLT 198 227 235   BMET Recent Labs    11/26/20 0550 11/27/20 0627  NA 133* 136  K 2.9* 3.9  CL 101 101  CO2 23 26  GLUCOSE 287* 179*  BUN 15 12  CREATININE 1.37* 1.18  CALCIUM 8.2* 8.4*   LFT No results for input(s): PROT, ALBUMIN, AST, ALT, ALKPHOS, BILITOT, BILIDIR, IBILI in the last 72 hours. PT/INR No results for input(s): LABPROT, INR in the last 72 hours. Hepatitis Panel No results for input(s): HEPBSAG, HCVAB, HEPAIGM, HEPBIGM in the last 72 hours.  Studies/Results: No results found.  ASSESMENT:   *   Painless hematochezia, presumed diverticular bleed.  *     Blood loss anemia. Hgb 7.5 >> 8.4 >> 7.8 >>  8.6  *    DM 2.   PLAN   *   From GI stand point ok to discharge home.  Will need repeat colonoscopy in a few months to remove the small colon polyps.  Will arrange office fup w Dr Jerilynn Mages in several weeks.   Would recheck CBC in ~ 10 to 14 days.      Azucena Freed  11/28/2020, 12:07 PM Phone [phone]

## 2020-11-28 NOTE — Progress Notes (Addendum)
   Subjective: HD#4   Overnight: No acute overnight events reported  Today, Jon Robinson. states that he is doing very well and has actually been able to ambulate in the hallway without dizziness, lightheadedness, sensation of presyncope.  He states that the last bowel movement he had was actually formed and brown however prior to that his other 2 bowel movement had little streaks of blood.    Objective:  Vital signs in last 24 hours: Vitals:   11/27/20 1408 11/27/20 1614 11/27/20 2054 11/28/20 0347  BP:  136/86 116/63 (!) 156/96  Pulse: 88 79 80 80  Resp:  20 18 20   Temp:  98.9 F (37.2 C) 98.6 F (37 C) 98.6 F (37 C)  TempSrc:  Oral Oral Oral  SpO2: 100% 99% 94% 100%  Weight:    90.7 kg  Height:       Const: In no apparent distress, lying comfortably in bed, conversational HEENT: Atraumatic, normocephalic Resp: CTA BL, no wheezes, crackles, rhonchi CV: RRR, no murmurs, gallop, rub Abd: Bowel sounds present, nondistended, nontender to palpation  Assessment/Plan:  Principal Problem:   Gastrointestinal hemorrhage associated with intestinal diverticulosis Active Problems:   Syncope and collapse  [name] is a 70 year old male with past medical history significant for HTN, T2DM and GERD who presented to [facility] on 01/29 for evaluation of syncope in the setting of hematochezia and THC consumption.  #Syncope secondary to acute blood loss # Acute Lower GI Bleed # Acute on chronic normocytic anemia # Reactive leukocytosis secondary to GIB Patient doing very well and has been ambulating without signs of presyncope.  His stools are now formed with no evidence of hematochezia or bright red blood per rectum.  He stable to discharge today from a GI and medicine standpoint.  Will need repeat CBC in 10 to 14 days per GI. -S/p 1U PRBC 2/1 -Hemoglobin stable at 8.6 -Given diverticulosis, will advise him to increase fiber in his meals to avoid constipation. -Repeat CBC in  10-14 days  #AKI resolved  #Mild aortic root dilation, active. Continue following with cardiology in outpatient setting #HTN, chronic. Blood pressures stable. Continue HCTZ 25mg  nightly #T2DM, chronic. CBGs at goal. Continue SSI. Adjust as needed.  #VTE ppx: SCDs #IVF: None #Diet: HH #Code status: Full code   Jean Rosenthal, MD 11/28/2020, 6:47 AM Pager: [phone] Internal Medicine Teaching Service After 5pm on weekdays and 1pm on weekends: On Call pager: [phone]

## 2020-11-28 NOTE — Progress Notes (Signed)
Discharge instructions (including medications) discussed with and copy provided to patient/caregiver 

## 2021-01-07 ENCOUNTER — Other Ambulatory Visit (INDEPENDENT_AMBULATORY_CARE_PROVIDER_SITE_OTHER): Payer: No Typology Code available for payment source

## 2021-01-07 ENCOUNTER — Encounter: Payer: Self-pay | Admitting: Gastroenterology

## 2021-01-07 ENCOUNTER — Ambulatory Visit (INDEPENDENT_AMBULATORY_CARE_PROVIDER_SITE_OTHER): Payer: No Typology Code available for payment source | Admitting: Gastroenterology

## 2021-01-07 VITALS — BP 150/88 | HR 80 | Ht 68.0 in | Wt 217.1 lb

## 2021-01-07 DIAGNOSIS — Z8719 Personal history of other diseases of the digestive system: Secondary | ICD-10-CM

## 2021-01-07 DIAGNOSIS — R112 Nausea with vomiting, unspecified: Secondary | ICD-10-CM

## 2021-01-07 DIAGNOSIS — R140 Abdominal distension (gaseous): Secondary | ICD-10-CM | POA: Diagnosis not present

## 2021-01-07 DIAGNOSIS — K5732 Diverticulitis of large intestine without perforation or abscess without bleeding: Secondary | ICD-10-CM | POA: Diagnosis not present

## 2021-01-07 DIAGNOSIS — K582 Mixed irritable bowel syndrome: Secondary | ICD-10-CM

## 2021-01-07 DIAGNOSIS — K219 Gastro-esophageal reflux disease without esophagitis: Secondary | ICD-10-CM | POA: Diagnosis not present

## 2021-01-07 DIAGNOSIS — Z86010 Personal history of colonic polyps: Secondary | ICD-10-CM

## 2021-01-07 DIAGNOSIS — K5790 Diverticulosis of intestine, part unspecified, without perforation or abscess without bleeding: Secondary | ICD-10-CM | POA: Diagnosis not present

## 2021-01-07 LAB — CBC
HCT: 35.6 % — ABNORMAL LOW (ref 39.0–52.0)
Hemoglobin: 11.5 g/dL — ABNORMAL LOW (ref 13.0–17.0)
MCHC: 32.4 g/dL (ref 30.0–36.0)
MCV: 80.2 fl (ref 78.0–100.0)
Platelets: 376 10*3/uL (ref 150.0–400.0)
RBC: 4.44 Mil/uL (ref 4.22–5.81)
RDW: 17 % — ABNORMAL HIGH (ref 11.5–15.5)
WBC: 8.3 10*3/uL (ref 4.0–10.5)

## 2021-01-07 LAB — IBC + FERRITIN
Ferritin: 14.2 ng/mL — ABNORMAL LOW (ref 22.0–322.0)
Iron: 35 ug/dL — ABNORMAL LOW (ref 42–165)
Saturation Ratios: 9.1 % — ABNORMAL LOW (ref 20.0–50.0)
Transferrin: 275 mg/dL (ref 212.0–360.0)

## 2021-01-07 LAB — FOLATE: Folate: 16.4 ng/mL (ref >5.9)

## 2021-01-07 LAB — VITAMIN B12: Vitamin B-12: 770 pg/mL (ref 211–911)

## 2021-01-07 LAB — CORTISOL: Cortisol, Plasma: 10.6 ug/dL

## 2021-01-07 MED ORDER — PEG 3350-KCL-NA BICARB-NACL 420 G PO SOLR: 4000.0000 mL | Freq: Once | ORAL | 0 refills | Status: AC

## 2021-01-07 NOTE — Patient Instructions (Addendum)
You have been scheduled for an endoscopy and colonoscopy. Please follow the written instructions given to you at your visit today. Please pick up your prep supplies at the pharmacy within the next 1-3 days. If you use inhalers (even only as needed), please bring them with you on the day of your procedure.  We have sent the following medications to your pharmacy for you to pick up at your convenience: Golytely   Your provider has requested that you go to the basement level for lab work before leaving today. Press "B" on the elevator. The lab is located at the first door on the left as you exit the elevator.  Due to recent changes in healthcare laws, you may see the results of your imaging and laboratory studies on MyChart before your provider has had a chance to review them.  We understand that in some cases there may be results that are confusing or concerning to you. Not all laboratory results come back in the same time frame and the provider may be waiting for multiple results in order to interpret others.  Please give [place] 48 hours in order for your provider to thoroughly review all the results before contacting the office for clarification of your results.   If you are age 69 or older, your body mass index should be between 23-30. Your Body mass index is 33.01 kg/m. If this is out of the aforementioned range listed, please consider follow up with your Primary Care Provider.  START: A high fiber diet with plenty of fluids (up to 8 glasses of water daily) is suggested to relieve these symptoms.  Metamucil, 1 tablespoon once or twice daily can be used to keep bowels regular if needed.  May consider Fiber Con once daily , if Metamucil is ineffective.   Thank you for choosing me and [facility].  Dr. Rush Landmark

## 2021-01-07 NOTE — Progress Notes (Signed)
[facility] VISIT   Primary Care Provider Donita Brooks, [place] [address] [phone]  Patient Profile: Jon Robinson. is a 70 y.o. male with a pmh significant for diabetes, hypertension, hyperlipidemia, allergies, arthritis, anxiety, nephrolithiasis, diverticulosis (with recent hospitalization for presumed diverticular hemorrhage), hemorrhoids, GERD, colon polyps.  The patient presents to the [facility] for an evaluation and management of problem(s) noted below:  Problem List 1. History of colon polyps   2. History of GI diverticular bleed   3. Diverticulosis   4. Gastroesophageal reflux disease, unspecified whether esophagitis present   5. Bloating   6. Non-intractable vomiting with nausea, unspecified vomiting type   7. Irritable bowel syndrome with both constipation and diarrhea     History of Present Illness This is a patient who I met in the hospital January.  He presented with bright red blood per rectum and evaluation of a colonoscopy perspective was suggestive of diverticular hemorrhage in the setting of pandiverticulosis.  Multiple colon polyps were found but were not resected as a result of the intention of the colonoscopy at the time and did not allow the picture.  Patient did well and was able to be discharged.  He was followed up with his PCP at the [facility] in February.  Overall he remained stable.  Repeat blood counts were not checked at that time.  However, in a community care referral was replaced back to [facility] for follow-up of his colon polyps and further GI issues..  Patient, states that he is not experiencing any issues with abdominal pain at this time.  He does experience acid reflux and heartburn infrequently needing to use Tums as well as in the past PPI therapy.  He is not currently on either.  He has episodes of bloating.  He does have nausea and takes ginger candy on a  regular basis but is not on any antiemetics.  He has anorexia that comes and goes.  He is currently dealing with constipation needing to take stool softeners and laxative therapy.  Now that he is doing that he is having a bowel movement every couple of days.  He has been diagnosed with irritable bowel syndrome in the past.  He has not been on Levsin or Bentyl in the past.  He has had episodes of rectal bleeding in the past but none since his hospitalization.  He does have concerns about his hemorrhoids.  He has never had an upper endoscopy.  GI Review of Systems Positive as above Negative for dysphagia, odynophagia, melena, incontinence  Review of Systems General: Denies fevers/chills/weight loss unintentionally Cardiovascular: Denies chest pain/palpitations Pulmonary: Denies shortness of breath Gastroenterological: See HPI Genitourinary: Denies darkened urine Hematological: Denies easy bruising/bleeding Endocrine: Denies temperature intolerance Dermatological: Denies jaundice Psychological: Mood is stable though he has periods of anxiety at times   Medications Current Outpatient Medications  Medication Sig Dispense Refill  . acetaminophen (TYLENOL) 325 MG tablet Take 325 mg by mouth every 6 (six) hours as needed (pain).    Marland Kitchen atorvastatin (LIPITOR) 20 MG tablet Take 20 mg by mouth daily.    . diclofenac Sodium (VOLTAREN) 1 % GEL Apply 1 application topically 3 (three) times daily as needed (left thumb pain).    . hydrochlorothiazide (HYDRODIURIL) 25 MG tablet Take 25 mg by mouth daily.    Marland Kitchen loratadine (CLARITIN) 10 MG tablet Take 10 mg by mouth daily.    . metFORMIN (GLUCOPHAGE) 500 MG tablet Take 500 mg  by mouth 3 (three) times daily.    . Polyvinyl Alcohol-Povidone PF 1.4-0.6 % SOLN Place 1 drop into both eyes in the morning, at noon, and at bedtime.    . senna-docusate (SENOKOT-S) 8.6-50 MG tablet Take 1 tablet by mouth daily.    . sildenafil (VIAGRA) 100 MG tablet TAKE ONE-HALF  TABLET BY MOUTH AS INSTRUCTED (TAKE 1 HOUR PRIOR TO SEXUAL ACTIVITY *DO NOT EXCEED 1 DOSE PER 24 HOUR PERIOD*)    . simethicone (MYLICON) 80 MG chewable tablet Chew 80 mg by mouth at bedtime.    . ferrous gluconate (FERGON) 324 MG tablet Take 1 tablet (324 mg total) by mouth daily with breakfast. 30 tablet 2   No current facility-administered medications for this visit.    Allergies Allergies  Allergen Reactions  . Novocain [Procaine] Swelling    In the face  . Penicillins Other (See Comments)    Childhood reaction    Histories Past Medical History:  Diagnosis Date  . Anxiety   . Arthritis   . Colon polyps   . Diabetes mellitus without complication ([place])    Diet controlled  . Diverticulosis   . GERD (gastroesophageal reflux disease)   . Headache   . History of kidney stones   . Hypertension   . Neuromuscular disorder ([place])    Finger tips numbness/tingling   Past Surgical History:  Procedure Laterality Date  . COLONOSCOPY WITH PROPOFOL N/A 11/25/2020   Procedure: COLONOSCOPY WITH PROPOFOL;  Surgeon: Rush Landmark Telford Nab., MD;  Location: [facility];  Service: Gastroenterology;  Laterality: N/A;  . FOOT SURGERY Left    x 2  . KIDNEY STONE SURGERY  10/1997  . KNEE ARTHROPLASTY Right 12/30/2017   Procedure: RIGHT TOTAL KNEE ARTHROPLASTY;  Surgeon: Rod Can, MD;  Location: [facility];  Service: Orthopedics;  Laterality: Right;  Needs RNFA  . WRIST FRACTURE SURGERY Right 1972   Social History   Socioeconomic History  . Marital status: Widowed    Spouse name: Not on file  . Number of children: 2  . Years of education: Not on file  . Highest education level: Not on file  Occupational History  . Occupation: retired  Tobacco Use  . Smoking status: Former Smoker    Packs/day: 0.50    Years: 25.00    Pack years: 12.50    Types: Cigarettes    Quit date: 12/31/2015    Years since quitting: 5.0  . Smokeless tobacco: Never Used  Vaping Use  . Vaping Use: Never used   Substance and Sexual Activity  . Alcohol use: Yes    Alcohol/week: 2.0 - 3.0 standard drinks    Types: 1 - 2 Cans of beer, 1 Shots of liquor per week    Comment: Weekly  . 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 Sexual activity: Not on file  Other Topics Concern  . Not on file  Social History Narrative  . Not on file   Social Determinants of Health   Financial Resource Strain: Not on file  Food Insecurity: Not on file  Transportation Needs: Not on file  Physical Activity: Not on file  Stress: Not on file  Social Connections: Not on file  Intimate Partner Violence: Not on file   Family History  Problem Relation Age of Onset  . Kidney failure Mother   . Hypertension Mother   . Diabetes Sister   .  Epilepsy Brother   . Kidney failure Brother 25  . Cancer Paternal Aunt        type unknown  . Diabetes Sister   . Diabetes Sister   . Epilepsy Son   . Bipolar disorder Son   . Colon cancer Neg Hx   . Esophageal cancer Neg Hx   . Inflammatory bowel disease Neg Hx   . Liver disease Neg Hx   . Pancreatic cancer Neg Hx   . Rectal cancer Neg Hx   . Stomach cancer Neg Hx    I have reviewed his medical, social, and family history in detail and updated the electronic medical record as necessary.    PHYSICAL EXAMINATION  BP (!) 150/88 (BP Location: Left Arm, Patient Position: Sitting, Cuff Size: Normal)   Pulse 80 Comment: irregular  Ht 5' 8"  (1.727 m) Comment: height measured without shoes  Wt 217 lb 2 oz (98.5 kg)   BMI 33.01 kg/m  Wt Readings from Last 3 Encounters:  01/07/21 217 lb 2 oz (98.5 kg)  11/28/20 199 lb 14.4 oz (90.7 kg)  03/22/18 210 lb (95.3 kg)  GEN: NAD, appears stated age, doesn't appear chronically ill PSYCH: Cooperative, without pressured speech EYE: Conjunctivae pink, sclerae anicteric ENT: Masked CV: Nontachycardic RESP: No audible wheezing GI: NABS, soft,  protuberant abdomen, rounded, nontender, without rebound or guarding MSK/EXT: No lower extremity edema SKIN: No jaundice NEURO:  Alert & Oriented x 3, no focal deficits   REVIEW OF DATA  I reviewed the following data at the time of this encounter:  GI Procedures and Studies  January 2022 colonoscopy - Hemorrhoids found on digital rectal exam. - The examined portion of the ileum was normal. - Blood in the entire examined colon. - Diverticulosis. - A few 2 to 5 mm polyps at the recto-sigmoid colon, in the sigmoid colon and in the descending colon. Resection not attempted. - Non-bleeding non-thrombosed external and internal hemorrhoids. - I evaluated and intubated the cecum on 6 occasions during the washing/clearance phases of the procedure from the rectum to cecum to evaluate for any active source of bleeding, but non were found on today's examination. - Etiology of presentation is from diverticular hemorrhage.  Laboratory Studies  Reviewed those in epic  Imaging Studies  November 26, 2020 CT abdomen pelvis with contrast IMPRESSION: VASCULAR 1. No significant abnormality of the mesenteric arteries or veins. NON-VASCULAR 1. No extravasation or delayed pooling of contrast identified within the stomach or bowel to indicate active GI bleed. 2. Diffuse colonic diverticulosis without evidence of acute diverticulitis.   ASSESSMENT  Jon Robinson is a 70 y.o. male with a pmh significant for diabetes, hypertension, hyperlipidemia, allergies, arthritis, anxiety, nephrolithiasis, diverticulosis (with recent hospitalization for presumed diverticular hemorrhage), hemorrhoids, GERD, colon polyps.  The patient is seen today for evaluation and management of:  1. History of colon polyps   2. History of GI diverticular bleed   3. Diverticulosis   4. Gastroesophageal reflux disease, unspecified whether esophagitis present   5. Bloating   6. Non-intractable vomiting with nausea, unspecified vomiting  type   7. Irritable bowel syndrome with both constipation and diarrhea    Patient is hemodynamically stable.  Clinically he has done well since his hospitalization.  Most recent hospitalization consistent with diverticular hemorrhage.  Thankfully he is not having any recurrence at this time.  With this being said, did have colon polyps that will need to be removed at some point in the coming year.  He has infrequent  episodes of GERD and uses Tums on a frequent basis.  He has never had an upper endoscopy.  He does have episodes of nausea as well as abdominal bloating.  We will plan to proceed with a diagnostic endoscopy at the time of his colonoscopy.  Of asked the patient to initiate a high-fiber diet in an effort of trying to relieve some symptoms.  Metamucil or FiberCon may be used on a daily basis in an effort of trying to optimize bowel habits while also continuing the use of laxative therapy on an as-needed basis hopefully if we move his bowels more frequently he will be feeling better.  I offered antiemetic therapy but the patient deferred on this.  We will obtain laboratories to evaluate for any evidence of persistent anemia.  We will rule out EPI as a potential source for his bloating symptom and consider SIBO in the future.  The risks and benefits of endoscopic evaluation were discussed with the patient; these include but are not limited to the risk of perforation, infection, bleeding, missed lesions, lack of diagnosis, severe illness requiring hospitalization, as well as anesthesia and sedation related illnesses.  The patient is agreeable to proceed.  All patient questions were answered to the best of my ability, and the patient agrees to the aforementioned plan of action with follow-up as indicated.   PLAN  Laboratories as outlined below Colonoscopy for colon polyp removal Diagnostic endoscopy (esophageal/gastric/duodenal biopsies at minimum) Fecal elastase to be obtained Consider SIBO breath  testing in future FiberCon or Metamucil once daily   Orders Placed This Encounter  Procedures  . CBC  . Cortisol  . IBC + Ferritin  . B12  . Folate  . Tissue transglutaminase, IgA  . Pancreatic elastase, fecal  . IgA  . Ambulatory referral to Gastroenterology    New Prescriptions   FERROUS GLUCONATE (FERGON) 324 MG TABLET    Take 1 tablet (324 mg total) by mouth daily with breakfast.   Modified Medications   No medications on file    Planned Follow Up No follow-ups on file.   Total Time in Face-to-Face and in Coordination of Care for patient including independent/personal interpretation/review of prior testing, medical history, examination, medication adjustment, communicating results with the patient directly, and documentation with the EHR is 30 minutes.  Justice Britain, MD [facility] Office # [phone]

## 2021-01-08 ENCOUNTER — Encounter: Payer: Self-pay | Admitting: Gastroenterology

## 2021-01-08 LAB — IGA: Immunoglobulin A: 238 mg/dL (ref 70–320)

## 2021-01-08 LAB — TISSUE TRANSGLUTAMINASE, IGA: (tTG) Ab, IgA: <1 U/mL

## 2021-01-13 ENCOUNTER — Telehealth: Payer: Self-pay

## 2021-01-13 DIAGNOSIS — K5732 Diverticulitis of large intestine without perforation or abscess without bleeding: Secondary | ICD-10-CM

## 2021-01-13 DIAGNOSIS — Z86010 Personal history of colonic polyps: Secondary | ICD-10-CM

## 2021-01-13 DIAGNOSIS — K582 Mixed irritable bowel syndrome: Secondary | ICD-10-CM

## 2021-01-13 DIAGNOSIS — Z8719 Personal history of other diseases of the digestive system: Secondary | ICD-10-CM

## 2021-01-13 DIAGNOSIS — K219 Gastro-esophageal reflux disease without esophagitis: Secondary | ICD-10-CM

## 2021-01-13 DIAGNOSIS — R140 Abdominal distension (gaseous): Secondary | ICD-10-CM

## 2021-01-13 DIAGNOSIS — R112 Nausea with vomiting, unspecified: Secondary | ICD-10-CM

## 2021-01-13 MED ORDER — FERROUS GLUCONATE 324 (38 FE) MG PO TABS: 324.0000 mg | ORAL_TABLET | Freq: Every day | ORAL | 2 refills | Status: DC

## 2021-01-13 NOTE — Telephone Encounter (Signed)
Rx for ferrous gluconate has been sent to pharmacy. Lab orders have been entered.

## 2021-01-14 DIAGNOSIS — R1110 Vomiting, unspecified: Secondary | ICD-10-CM | POA: Insufficient documentation

## 2021-01-14 DIAGNOSIS — Z86010 Personal history of colonic polyps: Secondary | ICD-10-CM | POA: Insufficient documentation

## 2021-01-14 DIAGNOSIS — K582 Mixed irritable bowel syndrome: Secondary | ICD-10-CM | POA: Insufficient documentation

## 2021-01-14 DIAGNOSIS — K5790 Diverticulosis of intestine, part unspecified, without perforation or abscess without bleeding: Secondary | ICD-10-CM | POA: Insufficient documentation

## 2021-01-14 DIAGNOSIS — R140 Abdominal distension (gaseous): Secondary | ICD-10-CM | POA: Insufficient documentation

## 2021-01-14 DIAGNOSIS — Z8719 Personal history of other diseases of the digestive system: Secondary | ICD-10-CM | POA: Insufficient documentation

## 2021-01-14 DIAGNOSIS — K219 Gastro-esophageal reflux disease without esophagitis: Secondary | ICD-10-CM | POA: Insufficient documentation

## 2021-01-24 ENCOUNTER — Other Ambulatory Visit: Payer: No Typology Code available for payment source

## 2021-01-24 DIAGNOSIS — R140 Abdominal distension (gaseous): Secondary | ICD-10-CM

## 2021-01-24 DIAGNOSIS — K582 Mixed irritable bowel syndrome: Secondary | ICD-10-CM

## 2021-01-24 DIAGNOSIS — R112 Nausea with vomiting, unspecified: Secondary | ICD-10-CM

## 2021-01-24 DIAGNOSIS — K219 Gastro-esophageal reflux disease without esophagitis: Secondary | ICD-10-CM

## 2021-01-24 DIAGNOSIS — K5790 Diverticulosis of intestine, part unspecified, without perforation or abscess without bleeding: Secondary | ICD-10-CM

## 2021-01-24 DIAGNOSIS — Z8719 Personal history of other diseases of the digestive system: Secondary | ICD-10-CM

## 2021-01-24 DIAGNOSIS — Z86010 Personal history of colonic polyps: Secondary | ICD-10-CM

## 2021-01-29 LAB — PANCREATIC ELASTASE, FECAL: Pancreatic Elastase-1, Stool: 58 mcg/g — ABNORMAL LOW

## 2021-01-30 ENCOUNTER — Telehealth: Payer: Self-pay | Admitting: Gastroenterology

## 2021-01-30 MED ORDER — ZENPEP 3000-10000 UNITS PO CPEP: 2.0000 | ORAL_CAPSULE | Freq: Three times a day (TID) | ORAL | 0 refills | Status: DC

## 2021-01-30 NOTE — Telephone Encounter (Signed)
Instructions given to the pt to take 2 with every meal.  He will call when finished with samples.

## 2021-01-30 NOTE — Telephone Encounter (Signed)
Patient called requesting to speak with a nurse to go over the instructions on how to take the Zenpep medication.

## 2021-03-04 ENCOUNTER — Ambulatory Visit (AMBULATORY_SURGERY_CENTER): Payer: Medicare PPO | Admitting: Gastroenterology

## 2021-03-04 ENCOUNTER — Telehealth: Payer: Self-pay | Admitting: Gastroenterology

## 2021-03-04 ENCOUNTER — Other Ambulatory Visit: Payer: Self-pay

## 2021-03-04 ENCOUNTER — Encounter: Payer: Self-pay | Admitting: Gastroenterology

## 2021-03-04 ENCOUNTER — Other Ambulatory Visit: Payer: Self-pay | Admitting: Gastroenterology

## 2021-03-04 VITALS — BP 169/110 | HR 72 | Temp 98.0°F | Resp 18 | Ht 68.0 in | Wt 217.0 lb

## 2021-03-04 DIAGNOSIS — D127 Benign neoplasm of rectosigmoid junction: Secondary | ICD-10-CM

## 2021-03-04 DIAGNOSIS — D124 Benign neoplasm of descending colon: Secondary | ICD-10-CM

## 2021-03-04 DIAGNOSIS — K209 Esophagitis, unspecified without bleeding: Secondary | ICD-10-CM

## 2021-03-04 DIAGNOSIS — B3781 Candidal esophagitis: Secondary | ICD-10-CM | POA: Diagnosis not present

## 2021-03-04 DIAGNOSIS — Z86010 Personal history of colonic polyps: Secondary | ICD-10-CM | POA: Diagnosis not present

## 2021-03-04 DIAGNOSIS — K222 Esophageal obstruction: Secondary | ICD-10-CM

## 2021-03-04 DIAGNOSIS — K2970 Gastritis, unspecified, without bleeding: Secondary | ICD-10-CM | POA: Diagnosis not present

## 2021-03-04 DIAGNOSIS — K635 Polyp of colon: Secondary | ICD-10-CM

## 2021-03-04 DIAGNOSIS — K219 Gastro-esophageal reflux disease without esophagitis: Secondary | ICD-10-CM | POA: Diagnosis not present

## 2021-03-04 DIAGNOSIS — K2950 Unspecified chronic gastritis without bleeding: Secondary | ICD-10-CM

## 2021-03-04 MED ORDER — SODIUM CHLORIDE 0.9 % IV SOLN: 500.0000 mL | Freq: Once | INTRAVENOUS | Status: DC

## 2021-03-04 MED ORDER — OMEPRAZOLE 40 MG PO CPDR: 40.0000 mg | DELAYED_RELEASE_CAPSULE | Freq: Two times a day (BID) | ORAL | 4 refills | Status: DC

## 2021-03-04 MED ORDER — PANTOPRAZOLE SODIUM 40 MG PO TBEC: 40.0000 mg | DELAYED_RELEASE_TABLET | Freq: Two times a day (BID) | ORAL | 4 refills | Status: DC

## 2021-03-04 MED ORDER — OMEPRAZOLE 40 MG PO CPDR: 40.0000 mg | DELAYED_RELEASE_CAPSULE | Freq: Two times a day (BID) | ORAL | 4 refills | Status: AC

## 2021-03-04 NOTE — Op Note (Signed)
[facility] Patient Name: Jon Robinson Procedure Date: 03/04/2021 1:33 PM MRN: [id number] Endoscopist: Justice Britain , MD Age: 70 Referring MD:  Date of Birth: 1951/07/22 Gender: Male Account #: [id number] Procedure:                Upper GI endoscopy Indications:              Generalized abdominal pain, Heartburn, Abdominal                            bloating, Nausea with vomiting Medicines:                Monitored Anesthesia Care Procedure:                Pre-Anesthesia Assessment:                           - Prior to the procedure, a History and Physical                            was performed, and patient medications and                            allergies were reviewed. The patient's tolerance of                            previous anesthesia was also reviewed. The risks                            and benefits of the procedure and the sedation                            options and risks were discussed with the patient.                            All questions were answered, and informed consent                            was obtained. Prior Anticoagulants: The patient has                            taken no previous anticoagulant or antiplatelet                            agents except for NSAID medication. ASA Grade                            Assessment: II - A patient with mild systemic                            disease. After reviewing the risks and benefits,                            the patient was deemed in satisfactory condition to  undergo the procedure.                           After obtaining informed consent, the endoscope was                            passed under direct vision. Throughout the                            procedure, the patient's blood pressure, pulse, and                            oxygen saturations were monitored continuously. The                            Endoscope was introduced through the mouth, and                             advanced to the second part of duodenum. The upper                            GI endoscopy was accomplished without difficulty.                            The patient tolerated the procedure. Scope In: Scope Out: Findings:                 No gross lesions were noted in the proximal                            esophagus and in the mid esophagus.                           White nummular lesions were noted in the distal                            esophagus. Biopsies were taken with a cold forceps                            for histology to rule out Candida.                           A non-obstructing Schatzki ring was found at the                            gastroesophageal junction.                           The Z-line was regular and was found 40 cm from the                            incisors.                           A 3 cm hiatal hernia was present.  Scattered severe inflammation characterized by                            erosions, erythema, friability, granularity and                            shallow ulcerations was found in the entire                            examined stomach. Biopsies were taken with a cold                            forceps for histology and Helicobacter pylori                            testing.                           No gross lesions were noted in the duodenal bulb,                            in the first portion of the duodenum and in the                            second portion of the duodenum. Biopsies were taken                            with a cold forceps for histology. Complications:            No immediate complications. Estimated Blood Loss:     Estimated blood loss was minimal. Impression:               - No gross lesions in esophagus proximally. White                            nummular lesions in esophageal mucosa distally -                            biopsied.                           -  Non-obstructing Schatzki ring.                           - Z-line regular, 40 cm from the incisors.                           - 3 cm hiatal hernia.                           - Gastritis. Biopsied.                           - No gross lesions in the duodenal bulb, in the  first portion of the duodenum and in the second                            portion of the duodenum. Biopsied. Recommendation:           - Proceed to scheduled colonoscopy.                           - Observe patient's clinical course.                           - Start Omeprazole 40 mg twice daily.                           - Await pathology results.                           - Repeat upper endoscopy in 4 months to check                            healing of ulceration.                           - The findings and recommendations were discussed                            with the patient.                           - The findings and recommendations were discussed                            with the designated responsible adult. Justice Britain, MD 03/04/2021 2:20:10 PM

## 2021-03-04 NOTE — Telephone Encounter (Signed)
Patient said the Jon Robinson was too expensive at [facility] and is requesting we send it to the [facility] instead in [place] he will be going there tomorrow.

## 2021-03-04 NOTE — Progress Notes (Signed)
pt tolerated well. VSS. awake and to recovery. Report given to RN.  

## 2021-03-04 NOTE — Telephone Encounter (Signed)
New prescription sent to [facility] in [place] per pt request.

## 2021-03-04 NOTE — Patient Instructions (Signed)
Handouts provided on gastritis, hiatal hernia, polyps, diverticulosis, hemorrhoids and high-fiber diet.   Recommend high-fiber diet.  Use FiberCon 1-2 tablets by mouth daily to prevent straining and constipation with bowel movements.   Start Omeprazole 40 mg by mouth twice daily. Prescription sent today. Continue present medications.   My nurse will contact you to schedule a repeat upper endoscopy in 4 months to check healing of ulcerations in stomach.   YOU HAD AN ENDOSCOPIC PROCEDURE TODAY AT [facility]:   Refer to the procedure report that was given to you for any specific questions about what was found during the examination.  If the procedure report does not answer your questions, please call your gastroenterologist to clarify.  If you requested that your care partner not be given the details of your procedure findings, then the procedure report has been included in a sealed envelope for you to review at your convenience later.  YOU SHOULD EXPECT: Some feelings of bloating in the abdomen. Passage of more gas than usual.  Walking can help get rid of the air that was put into your GI tract during the procedure and reduce the bloating. If you had a lower endoscopy (such as a colonoscopy or flexible sigmoidoscopy) you may notice spotting of blood in your stool or on the toilet paper. If you underwent a bowel prep for your procedure, you may not have a normal bowel movement for a few days.  Please Note:  You might notice some irritation and congestion in your nose or some drainage.  This is from the oxygen used during your procedure.  There is no need for concern and it should clear up in a day or so.  SYMPTOMS TO REPORT IMMEDIATELY:   Following lower endoscopy (colonoscopy or flexible sigmoidoscopy):  Excessive amounts of blood in the stool  Significant tenderness or worsening of abdominal pains  Swelling of the abdomen that is new, acute  Fever of 100F or  higher   Following upper endoscopy (EGD)  Vomiting of blood or coffee ground material  New chest pain or pain under the shoulder blades  Painful or persistently difficult swallowing  New shortness of breath  Fever of 100F or higher  Black, tarry-looking stools  For urgent or emergent issues, a gastroenterologist can be reached at any hour by calling [phone]. Do not use MyChart messaging for urgent concerns.    DIET:  We do recommend a small meal at first, but then you may proceed to your regular diet.  Drink plenty of fluids but you should avoid alcoholic beverages for 24 hours.  ACTIVITY:  You should plan to take it easy for the rest of today and you should NOT DRIVE or use heavy machinery until tomorrow (because of the sedation medicines used during the test).    FOLLOW UP: Our staff will call the number listed on your records 48-72 hours following your procedure to check on you and address any questions or concerns that you may have regarding the information given to you following your procedure. If we do not reach you, we will leave a message.  We will attempt to reach you two times.  During this call, we will ask if you have developed any symptoms of COVID 19. If you develop any symptoms (ie: fever, flu-like symptoms, shortness of breath, cough etc.) before then, please call [phone].  If you test positive for Covid 19 in the 2 weeks post procedure, please call and report this information to [place].  If any biopsies were taken you will be contacted by phone or by letter within the next 1-3 weeks.  Please call us at [phone] if you have not heard about the biopsies in 3 weeks.    SIGNATURES/CONFIDENTIALITY: You and/or your care partner have signed paperwork which will be entered into your electronic medical record.  These signatures attest to the fact that that the information above on your After Visit Summary has been reviewed and is understood.  Full responsibility of  the confidentiality of this discharge information lies with you and/or your care-partner.

## 2021-03-04 NOTE — Progress Notes (Addendum)
Pt called back stating Omeprazole was too expensive from [facility] and asked for a new prescription to be sent to [facility] in [place]. New prescription sent, pt made aware. States he is going there tomorrow.

## 2021-03-04 NOTE — Progress Notes (Signed)
Pt. Admitted to smoking 2 blunts today and stated "I smoke them daily ,made CRNA aware prior to procedure.  PT.denies solid stool,picture showed to pt.for how stool looks after completing prep and he pointed at the turbid picture.  Check-in-JB  VITAL SIGNS-CW

## 2021-03-04 NOTE — Op Note (Signed)
[facility] Patient Name: Jon Robinson Procedure Date: 03/04/2021 1:30 PM MRN: [id number] Endoscopist: Justice Britain , MD Age: 70 Referring MD:  Date of Birth: 11/08/1950 Gender: Male Account #: [id number] Procedure:                Colonoscopy Indications:              High risk colon cancer surveillance: Personal                            history of colonic polyps, , Incidental -                            Diverticula - not removed at last colonoscopy which                            was done when hospitalized with a diverticular                            hemorrhage Medicines:                Monitored Anesthesia Care Procedure:                Pre-Anesthesia Assessment:                           - Prior to the procedure, a History and Physical                            was performed, and patient medications and                            allergies were reviewed. The patient's tolerance of                            previous anesthesia was also reviewed. The risks                            and benefits of the procedure and the sedation                            options and risks were discussed with the patient.                            All questions were answered, and informed consent                            was obtained. Prior Anticoagulants: The patient has                            taken no previous anticoagulant or antiplatelet                            agents except for NSAID medication. ASA Grade  Assessment: II - A patient with mild systemic                            disease. After reviewing the risks and benefits,                            the patient was deemed in satisfactory condition to                            undergo the procedure.                           After obtaining informed consent, the colonoscope                            was passed under direct vision. Throughout the                            procedure,  the patient's blood pressure, pulse, and                            oxygen saturations were monitored continuously. The                            Olympus CF-HQ190 [id number]) Colonoscope was                            introduced through the anus and advanced to the the                            cecum, identified by appendiceal orifice and                            ileocecal valve. The colonoscopy was somewhat                            difficult due to significant looping. Successful                            completion of the procedure was aided by changing                            the patient's position, using manual pressure,                            straightening and shortening the scope to obtain                            bowel loop reduction and using scope torsion. The                            patient tolerated the procedure. The quality of the  bowel preparation was adequate. The ileocecal                            valve, appendiceal orifice, and rectum were                            photographed. Scope In: 1:53:23 PM Scope Out: 2:12:21 PM Scope Withdrawal Time: 0 hours 13 minutes 55 seconds  Total Procedure Duration: 0 hours 18 minutes 58 seconds  Findings:                 The digital rectal exam findings include                            hemorrhoids. Pertinent negatives include no                            palpable rectal lesions.                           Four sessile polyps were found in the recto-sigmoid                            colon (2) and descending colon (2). The polyps were                            2 to 5 mm in size. These polyps were removed with a                            cold snare. Resection and retrieval were complete.                           Many small-mouthed diverticula were found in the                            entire colon.                           Normal mucosa was found in the entire colon                             otherwise.                           Non-bleeding non-thrombosed external and internal                            hemorrhoids were found during retroflexion, during                            perianal exam and during digital exam. The                            hemorrhoids were Grade II (internal hemorrhoids  that prolapse but reduce spontaneously). Complications:            No immediate complications. Estimated Blood Loss:     Estimated blood loss was minimal. Impression:               - Hemorrhoids found on digital rectal exam.                           - Four 2 to 5 mm polyps at the recto-sigmoid colon                            and in the descending colon, removed with a cold                            snare. Resected and retrieved.                           - Diverticulosis in the entire examined colon.                           - Normal mucosa in the entire examined colon                            otherwise.                           - Non-bleeding non-thrombosed external and internal                            hemorrhoids. Recommendation:           - The patient will be observed post-procedure,                            until all discharge criteria are met.                           - Discharge patient to home.                           - Patient has a contact number available for                            emergencies. The signs and symptoms of potential                            delayed complications were discussed with the                            patient. Return to normal activities tomorrow.                            Written discharge instructions were provided to the                            patient.                           -  High fiber diet.                           - Use FiberCon 1-2 tablets PO daily.                           - Continue present medications.                           - Await pathology results.                            - Repeat colonoscopy for surveillance based on                            pathology results.                           - Will discuss with my primary team to send in                            prescription for Creon supplementation for EPI                            treatment (he felt improvement with samples) and                            potentially Patient Navigation depending on whether                            patient can afford or not based on insurance. Will                            need additional follow up in clinic in 6-8 weeks as                            well as possible EUS vs Pancreas MRICP in future.                           - The findings and recommendations were discussed                            with the patient.                           - The findings and recommendations were discussed                            with the designated responsible adult. Justice Britain, MD 03/04/2021 2:25:48 PM

## 2021-03-04 NOTE — Addendum Note (Signed)
Addended by: Jess Barters L on: 03/04/2021 05:30 PM   Modules accepted: Orders

## 2021-03-04 NOTE — Addendum Note (Signed)
Addended by: Casper Harrison on: 03/04/2021 05:38 PM   Modules accepted: Orders

## 2021-03-06 ENCOUNTER — Telehealth: Payer: Self-pay | Admitting: *Deleted

## 2021-03-06 NOTE — Telephone Encounter (Signed)
  Follow up Call-  Call back number 03/04/2021  Post procedure Call Back phone  # [phone]  Permission to leave phone message Yes  Some recent data might be hidden     Patient questions:  Do you have a fever, pain , or abdominal swelling? No. Pain Score  0 *  Have you tolerated food without any problems? Yes.    Have you been able to return to your normal activities? Yes.    Do you have any questions about your discharge instructions: Diet   No. Medications  No. Follow up visit  No.  Do you have questions or concerns about your Care? No.  Actions: * If pain score is 4 or above: 1. No action needed, pain <4.Have you developed a fever since your procedure? no  2.   Have you had an respiratory symptoms (SOB or cough) since your procedure? no  3.   Have you tested positive for COVID 19 since your procedure no  4.   Have you had any family members/close contacts diagnosed with the COVID 19 since your procedure?  no   If yes to any of these questions please route to Joylene John, RN and Joella Prince, RN

## 2021-03-06 NOTE — Telephone Encounter (Signed)
  Follow up Call-  Call back number 03/04/2021  Post procedure Call Back phone  # [phone]  Permission to leave phone message Yes  Some recent data might be hidden   [facility]

## 2021-03-13 ENCOUNTER — Encounter: Payer: Self-pay | Admitting: Gastroenterology

## 2021-03-14 ENCOUNTER — Other Ambulatory Visit: Payer: Self-pay

## 2021-03-14 MED ORDER — FLUCONAZOLE 200 MG PO TABS: ORAL_TABLET | ORAL | 0 refills | Status: AC

## 2021-05-20 IMAGING — CT CT HEAD W/O CM
3 series · 15 of 47 positions shown, 18 images · non-contrast
Comparison: None.

CLINICAL DATA: Syncope, simple, abnormal neuro exam

EXAM:
CT HEAD WITHOUT CONTRAST
TECHNIQUE: Contiguous axial images were obtained from the base of the skull
through the vertex without intravenous contrast.

[Series 3: head 5.0 h30s · axial · 0.45mm/px · z∈[-60,+75]mm · 9 of 33 slices shown, 12 images]
[im 3/33  brain]
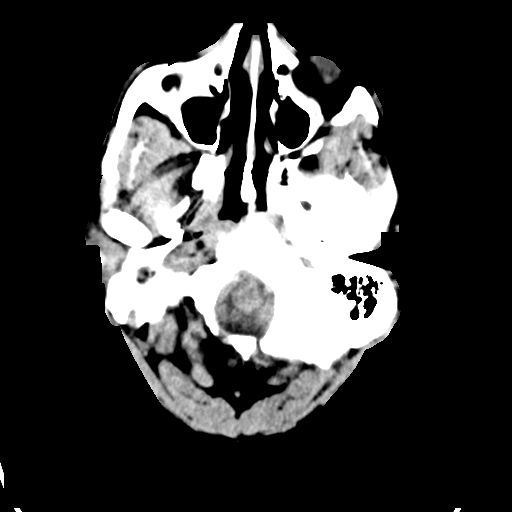
[im 3/33  bone]
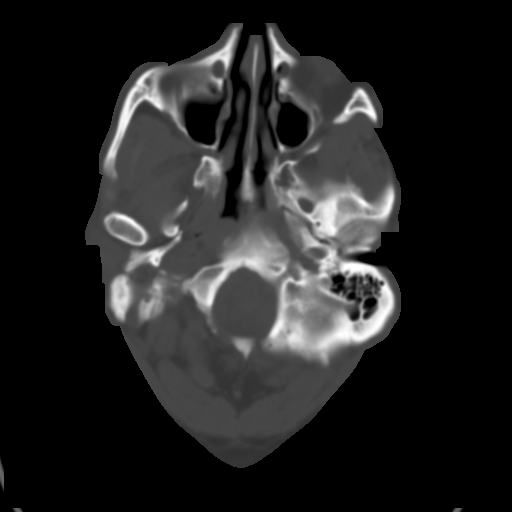
[im 6/33  brain]
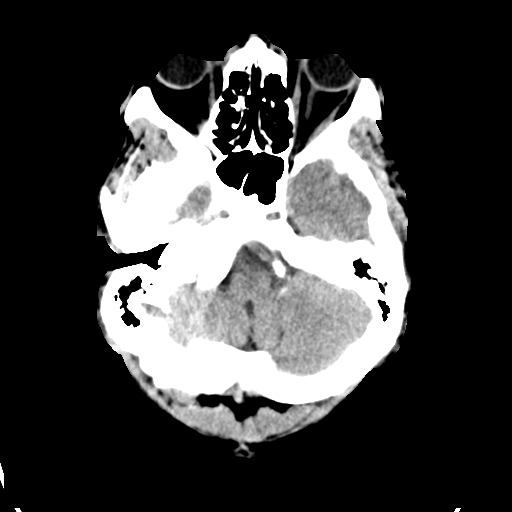
[im 9/33  brain]
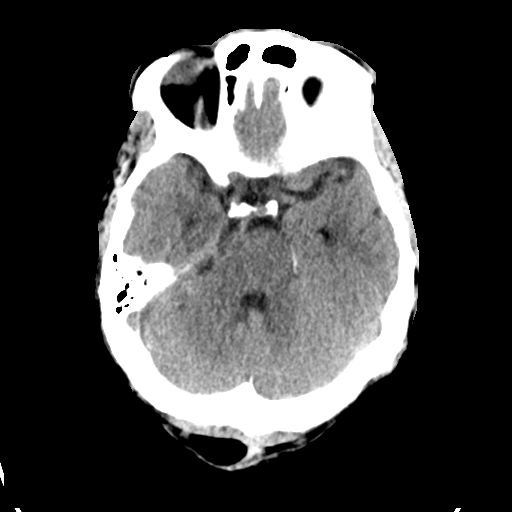
[im 13/33  brain]
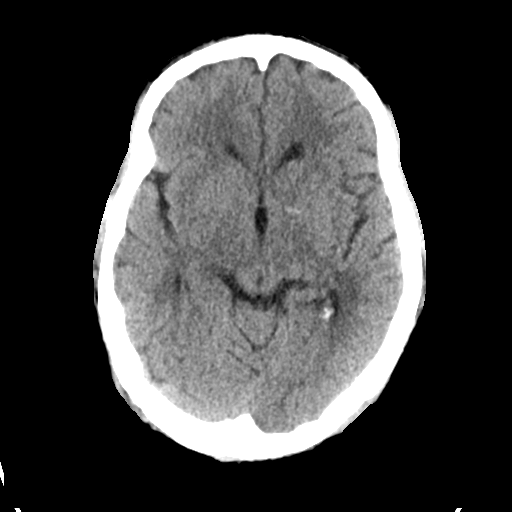
[im 17/33  brain]
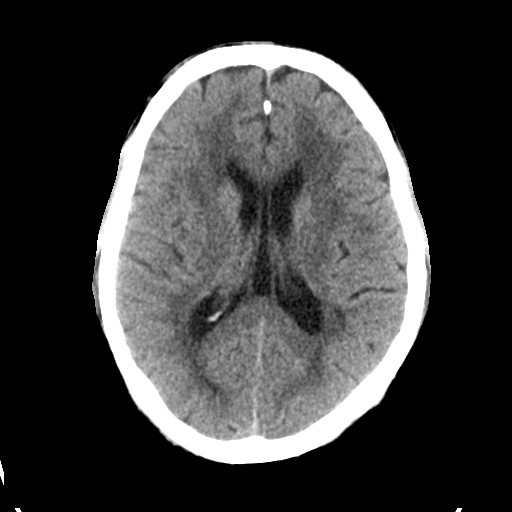
[im 17/33  bone]
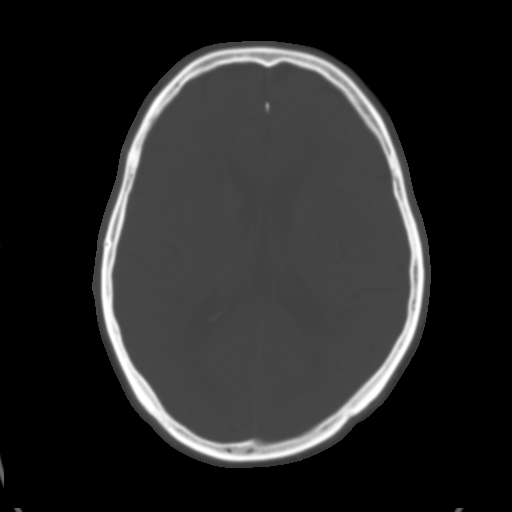
[im 20/33  brain]
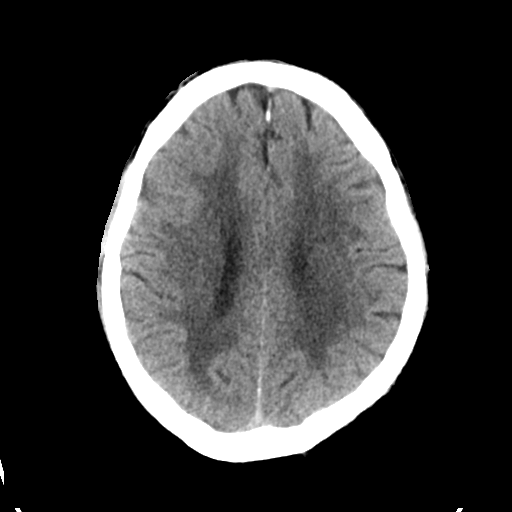
[im 24/33  brain]
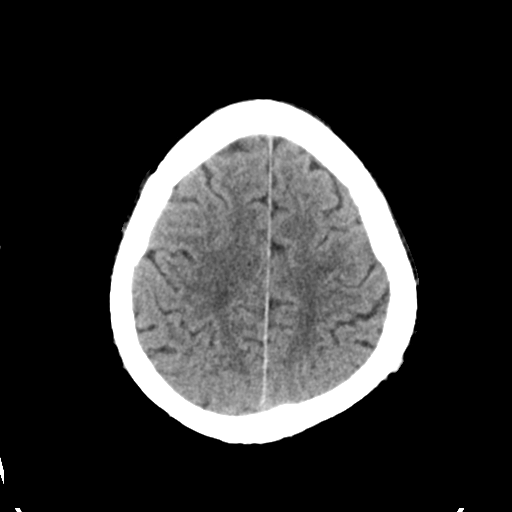
[im 27/33  brain]
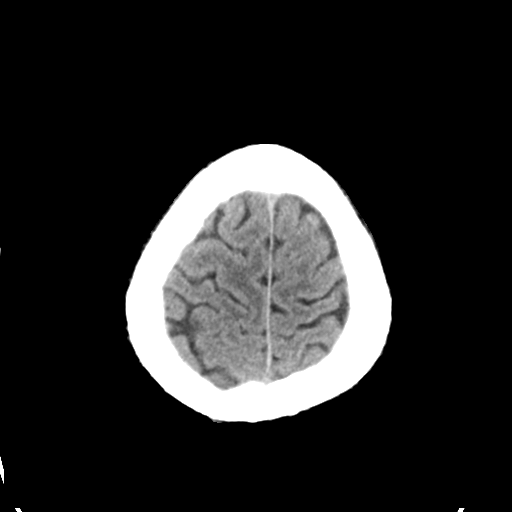
[im 30/33  brain]
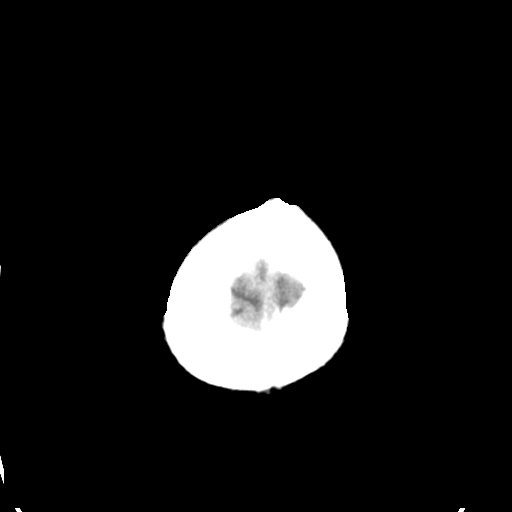
[im 30/33  bone]
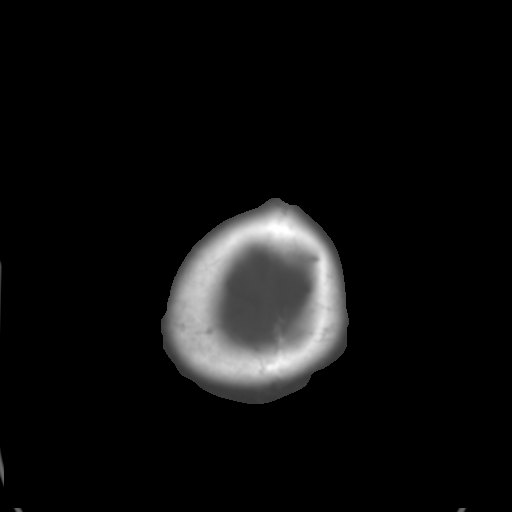

[Series 5: head 3.0 mpr cor · coronal · 0.35mm/px · 3 of 74 slices shown]
[im 25/74  brain]
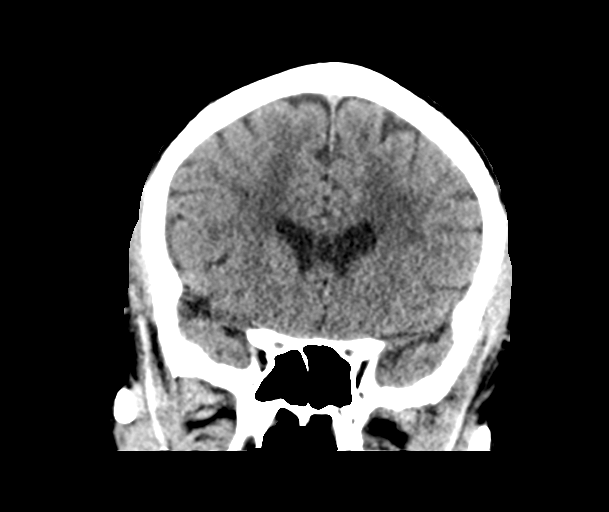
[im 33/74  brain]
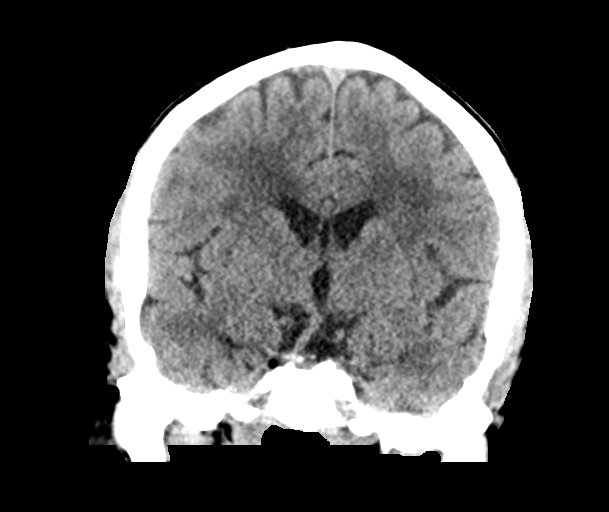
[im 41/74  brain]
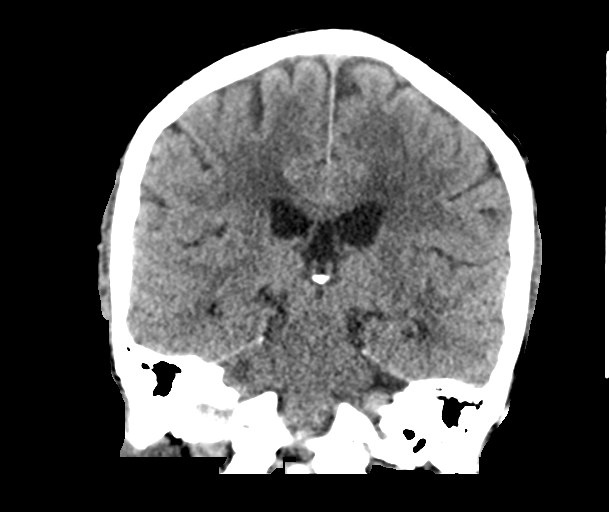

[Series 6: head 3.0 mpr sag · sagittal · 0.35mm/px · 3 of 72 slices shown]
[im 24/72  brain]
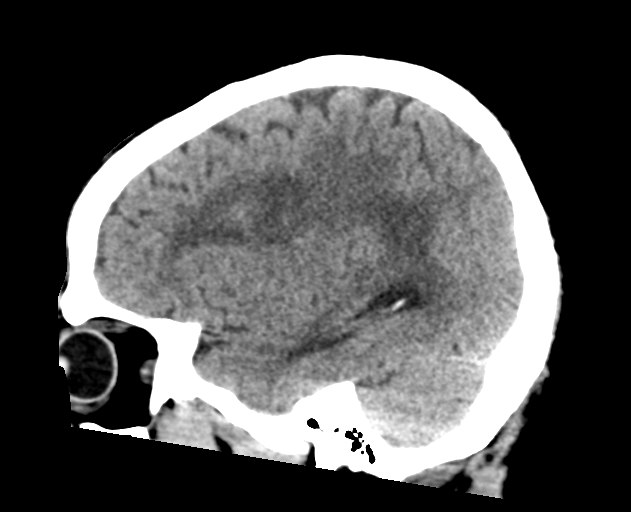
[im 36/72  brain]
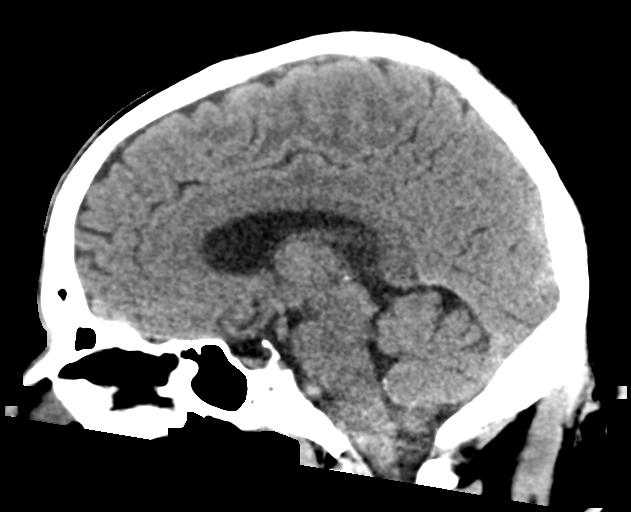
[im 48/72  brain]
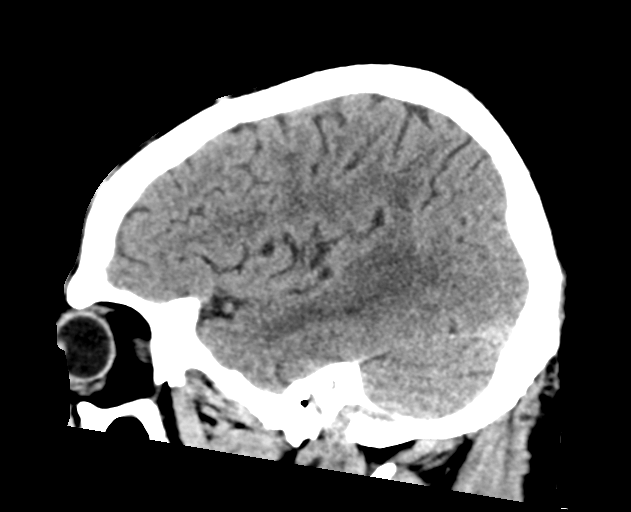

[15 of 47 positions shown; findings below may reference images not displayed]

FINDINGS: Brain: Brain volume is normal for age. No intracranial hemorrhage,
mass effect, or midline shift. No hydrocephalus. The basilar
cisterns are patent. There is moderate periventricular and deep
white matter hypodensity. No evidence of territorial infarct or
acute ischemia. No extra-axial or intracranial fluid collection.

Vascular: No hyperdense vessel.

Skull: No fracture or focal lesion.

Sinuses/Orbits: Scattered mucosal thickening throughout the ethmoid
air cells and maxillary sinuses. No sinus fluid level. Orbits are
unremarkable. Mastoid air cells are clear.

Other: None.
IMPRESSION: 1. No acute intracranial abnormality.
2. Moderate periventricular and deep white matter hypodensity,
nonspecific but typically chronic small vessel ischemia.

## 2021-05-23 IMAGING — CT CT CTA ABD/PEL W/CM AND/OR W/O CM
2 of 16 series · 10 of 46 positions shown, 17 images · IV contrast (omnipaque)
Comparison: None.

CLINICAL DATA: Rectal bleeding that began past [REDACTED].

EXAM:
CTA ABDOMEN AND PELVIS WITHOUT AND WITH CONTRAST
TECHNIQUE: Multidetector CT imaging of the abdomen and pelvis was performed
using the standard protocol during bolus administration of
intravenous contrast. Multiplanar reconstructed images and MIPs were
obtained and reviewed to evaluate the vascular anatomy.
CONTRAST:  100mL OMNIPAQUE IOHEXOL 350 MG/ML SOLN

[Series 14: venous thins · axial · portal-venous · 0.77mm/px · z∈[+990,+1339]mm · 9 of 1066 slices shown, 15 images]
[im 97/1066  soft-tissue]
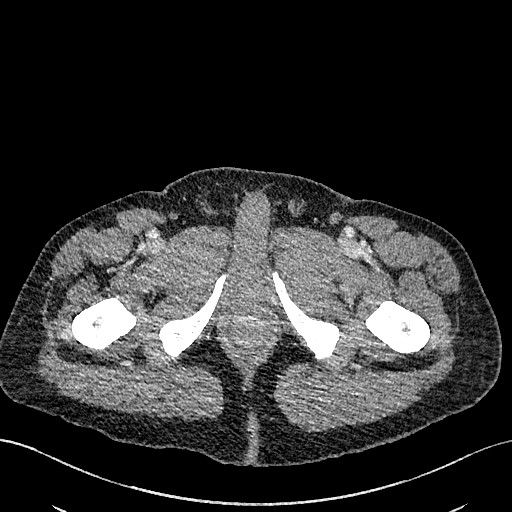
[im 97/1066  bone]
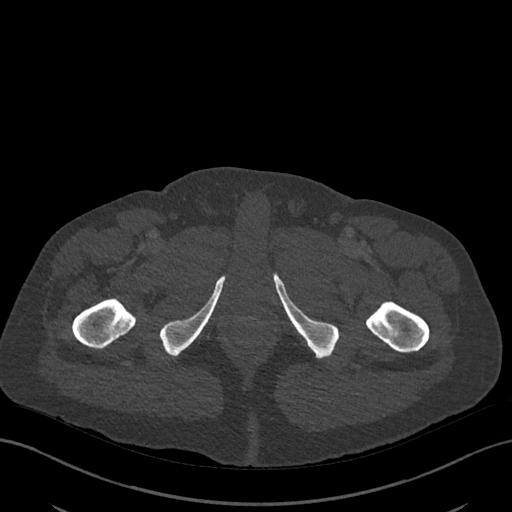
[im 194/1066  soft-tissue]
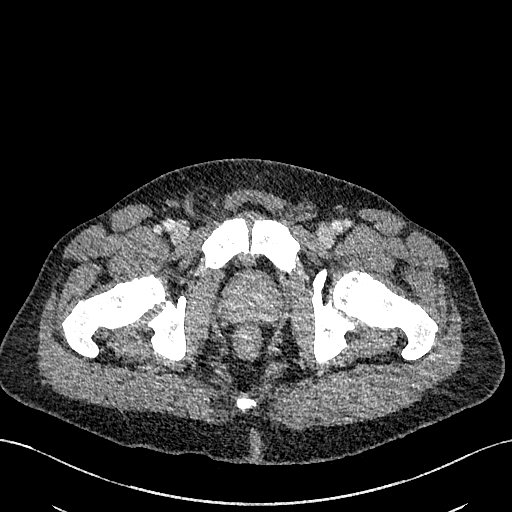
[im 291/1066  soft-tissue]
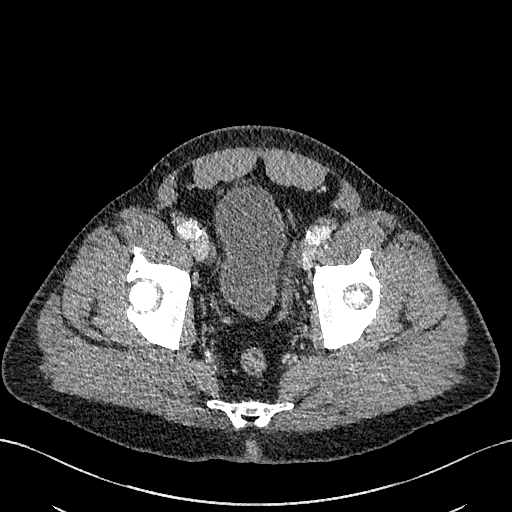
[im 388/1066  soft-tissue]
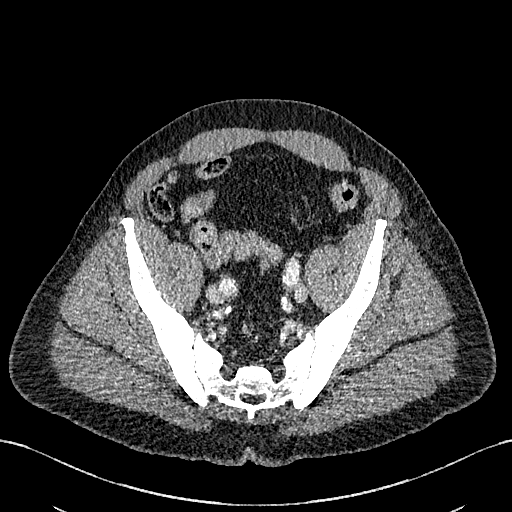
[im 581/1066  soft-tissue]
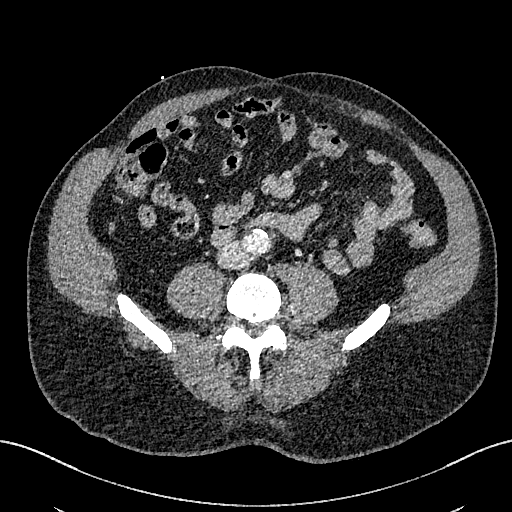
[im 678/1066  soft-tissue]
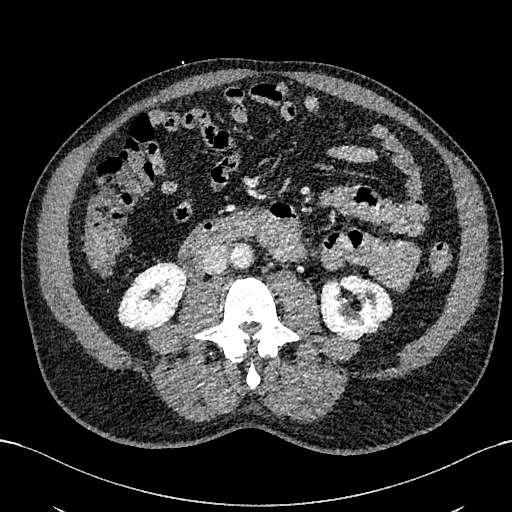
[im 678/1066  lung]
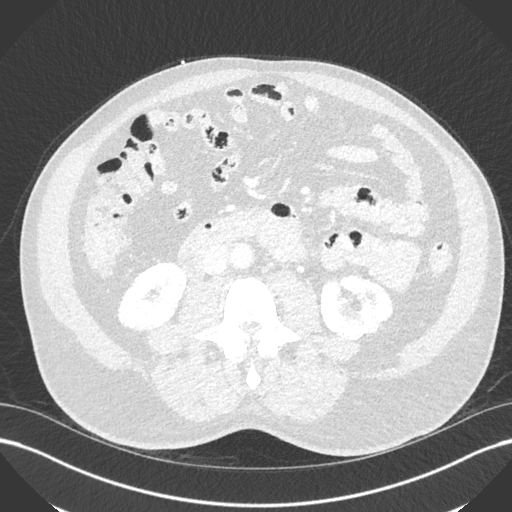
[im 775/1066  soft-tissue]
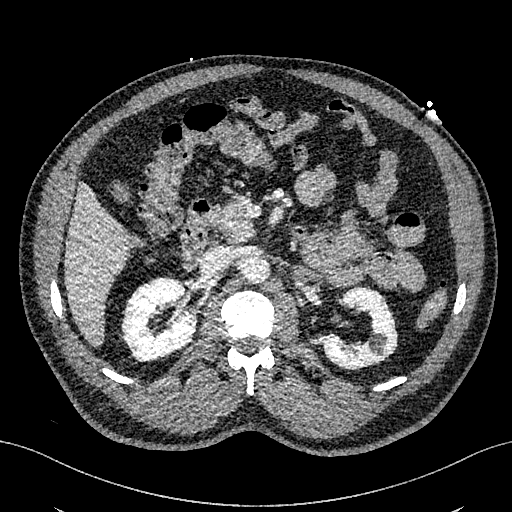
[im 775/1066  lung]
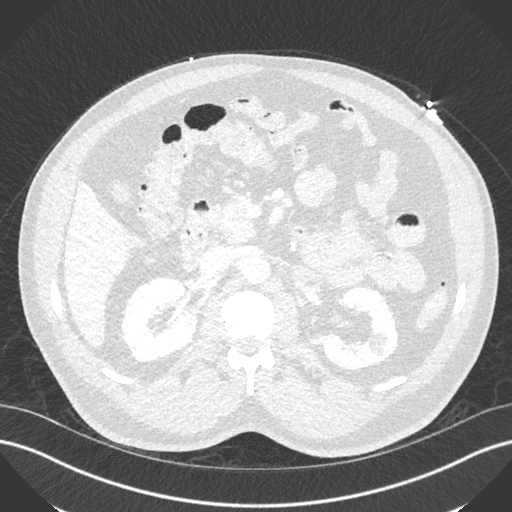
[im 872/1066  soft-tissue]
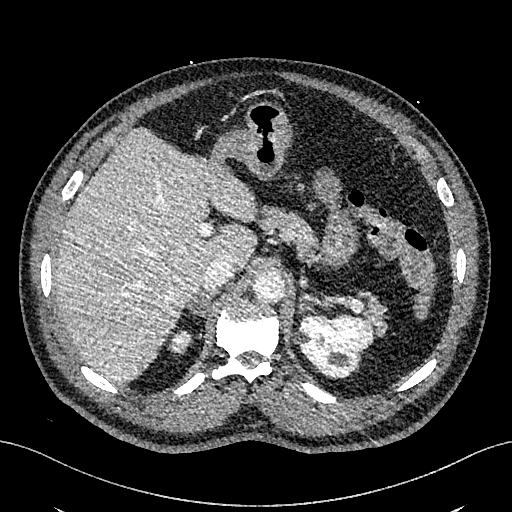
[im 872/1066  lung]
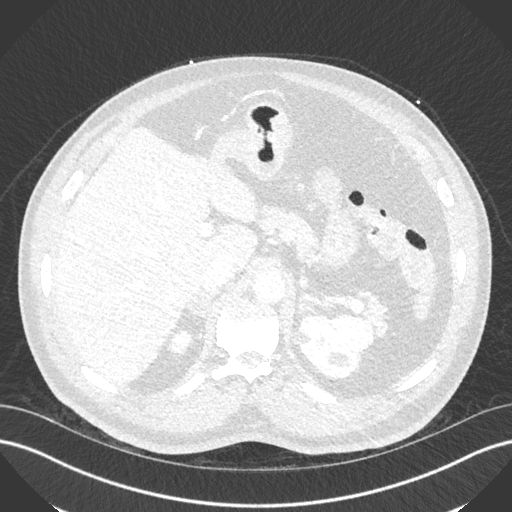
[im 969/1066  soft-tissue]
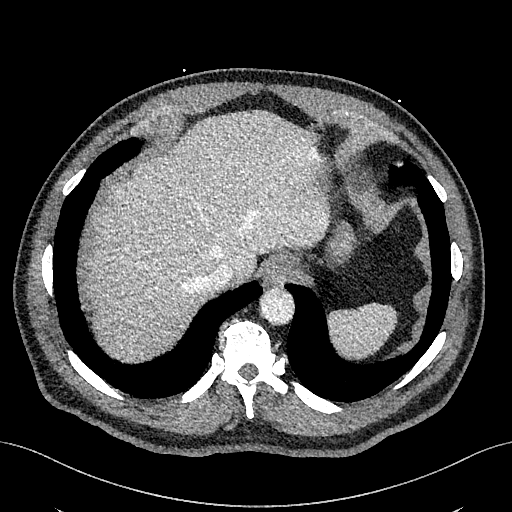
[im 969/1066  lung]
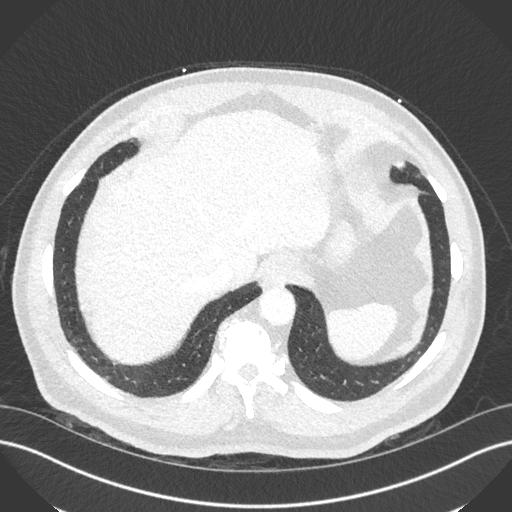
[im 969/1066  bone]
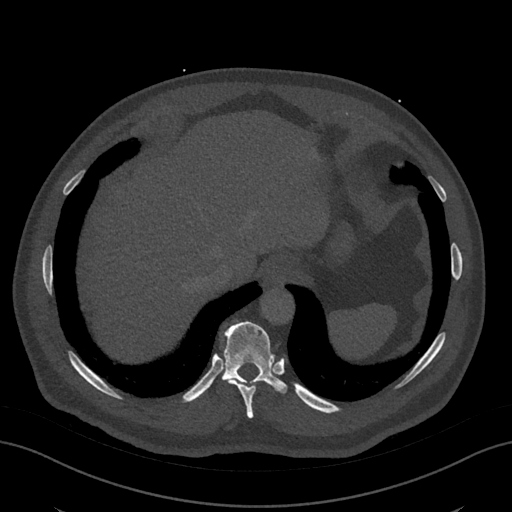

[Series 16: cor · coronal · 0.76mm/px · 1 of 151 slices shown, 2 images]
[im 76/151  soft-tissue]
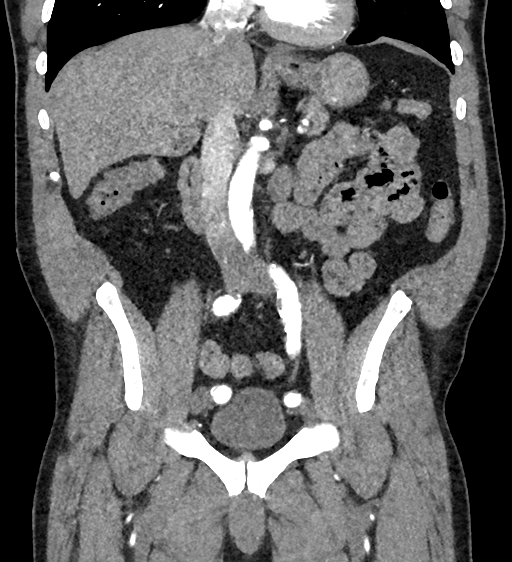
[im 76/151  bone]
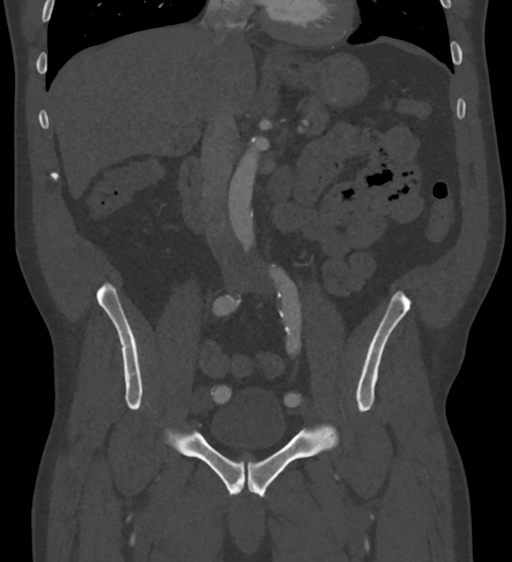

[10 of 46 positions shown; findings below may reference images not displayed]

FINDINGS: VASCULAR

Aorta: Mild scattered atherosclerotic calcifications without
aneurysmal dilatation or flow-limiting stenosis.

Celiac: Patent without evidence of aneurysm, dissection, vasculitis
or significant stenosis.

SMA: Patent without evidence of aneurysm, dissection, vasculitis or
significant stenosis.

Renals: Both renal arteries are patent without evidence of aneurysm,
dissection, vasculitis, fibromuscular dysplasia or significant
stenosis.

IMA: Patent without evidence of aneurysm, dissection, vasculitis or
significant stenosis.

Inflow: Patent without evidence of aneurysm, dissection, vasculitis
or significant stenosis.

Proximal Outflow: Bilateral common femoral and visualized portions
of the superficial and profunda femoral arteries are patent without
evidence of aneurysm, dissection, vasculitis or significant
stenosis.

Veins: Portal, super mesenteric, and hepatic veins are patent.

Review of the MIP images confirms the above findings.

NON-VASCULAR

Lower chest: No acute abnormality. Diffuse density of the blood pool
on the noncontrast images relative to the myocardium suggestive of
anemia.

Hepatobiliary: No focal liver abnormality is seen. No gallstones,
gallbladder wall thickening, or biliary dilatation.

Pancreas: Unremarkable. No pancreatic ductal dilatation or
surrounding inflammatory changes.

Spleen: Normal in size without focal abnormality.

Adrenals/Urinary Tract: The adrenal glands are thickened indicative
of hyperplasia. There is a 14 mm low-density nodule in the medial
limb of the right adrenal gland consistent with an adenoma.

1.5 cm simple cyst present the upper pole of the left kidney. 1 mm
nonobstructing calculus in the upper pole of the right kidney (71/8)
mild left pelvicaliectasis without significant dilatation of the
renal pelvis or ureter.

Ureters are unremarkable.

No significant abnormality of the bladder

Stomach/Bowel: No bowel dilatation to indicate ileus or obstruction.
There is diffuse colonic diverticulosis without evidence of acute
diverticulitis. No active extravasation is identified. Appendix is
normal.

Lymphatic: No enlarged abdominal or pelvic lymph nodes.

Reproductive: Mildly enlarged prostate.

Other: Small fat containing umbilical and right inguinal hernias.

Musculoskeletal: Advanced degenerative changes of the facets of the
lumbar spine.
IMPRESSION: VASCULAR

1. No significant abnormality of the mesenteric arteries or veins.

NON-VASCULAR

1. No extravasation or delayed pooling of contrast identified within
the stomach or bowel to indicate active GI bleed.
2. Diffuse colonic diverticulosis without evidence of acute
diverticulitis.

## 2021-05-27 ENCOUNTER — Other Ambulatory Visit (INDEPENDENT_AMBULATORY_CARE_PROVIDER_SITE_OTHER): Payer: No Typology Code available for payment source

## 2021-05-27 ENCOUNTER — Ambulatory Visit (INDEPENDENT_AMBULATORY_CARE_PROVIDER_SITE_OTHER): Payer: No Typology Code available for payment source | Admitting: Gastroenterology

## 2021-05-27 ENCOUNTER — Encounter: Payer: Self-pay | Admitting: Gastroenterology

## 2021-05-27 VITALS — BP 132/80 | HR 66 | Ht 69.0 in | Wt 204.0 lb

## 2021-05-27 DIAGNOSIS — D509 Iron deficiency anemia, unspecified: Secondary | ICD-10-CM | POA: Diagnosis not present

## 2021-05-27 DIAGNOSIS — K2950 Unspecified chronic gastritis without bleeding: Secondary | ICD-10-CM

## 2021-05-27 DIAGNOSIS — D649 Anemia, unspecified: Secondary | ICD-10-CM | POA: Insufficient documentation

## 2021-05-27 DIAGNOSIS — K259 Gastric ulcer, unspecified as acute or chronic, without hemorrhage or perforation: Secondary | ICD-10-CM

## 2021-05-27 DIAGNOSIS — D508 Other iron deficiency anemias: Secondary | ICD-10-CM

## 2021-05-27 DIAGNOSIS — L02415 Cutaneous abscess of right lower limb: Secondary | ICD-10-CM

## 2021-05-27 DIAGNOSIS — B3781 Candidal esophagitis: Secondary | ICD-10-CM

## 2021-05-27 DIAGNOSIS — K2970 Gastritis, unspecified, without bleeding: Secondary | ICD-10-CM

## 2021-05-27 DIAGNOSIS — K219 Gastro-esophageal reflux disease without esophagitis: Secondary | ICD-10-CM

## 2021-05-27 DIAGNOSIS — L02215 Cutaneous abscess of perineum: Secondary | ICD-10-CM | POA: Diagnosis not present

## 2021-05-27 LAB — CBC
HCT: 36.8 % — ABNORMAL LOW (ref 39.0–52.0)
Hemoglobin: 12.3 g/dL — ABNORMAL LOW (ref 13.0–17.0)
MCHC: 33.3 g/dL (ref 30.0–36.0)
MCV: 78.6 fl (ref 78.0–100.0)
Platelets: 275 10*3/uL (ref 150.0–400.0)
RBC: 4.68 Mil/uL (ref 4.22–5.81)
RDW: 18.6 % — ABNORMAL HIGH (ref 11.5–15.5)
WBC: 8.7 10*3/uL (ref 4.0–10.5)

## 2021-05-27 LAB — HEMOGLOBIN A1C: Hgb A1c MFr Bld: 7.4 % — ABNORMAL HIGH (ref 4.6–6.5)

## 2021-05-27 LAB — IBC + FERRITIN
Ferritin: 66.6 ng/mL (ref 22.0–322.0)
Iron: 30 ug/dL — ABNORMAL LOW (ref 42–165)
Saturation Ratios: 10.1 % — ABNORMAL LOW (ref 20.0–50.0)
Transferrin: 212 mg/dL (ref 212.0–360.0)

## 2021-05-27 NOTE — Patient Instructions (Addendum)
Your provider has requested that you go to the basement level for lab work before leaving today. Press "B" on the elevator. The lab is located at the first door on the left as you exit the elevator.  Due to recent changes in healthcare laws, you may see the results of your imaging and laboratory studies on MyChart before your provider has had a chance to review them.  We understand that in some cases there may be results that are confusing or concerning to you. Not all laboratory results come back in the same time frame and the provider may be waiting for multiple results in order to interpret others.  Please give [place] 48 hours in order for your provider to thoroughly review all the results before contacting the office for clarification of your results.    Decrease your Omeprazole to '40mg'$  - Take once daily..   Continue taking Iron as directed.   Follow up in 6 months. Office to call with an appointment.   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   Please see information regarding [employer] below.    How to Take a [employer] A sitz bath is a warm water bath that may be used to care for your rectum, genital area, or the area between your rectum and genitals (perineum). In a sitz bath, the water only comes up to your hips and covers your buttocks. A sitz bath may be done in a bathtub or with a portable sitz baththat fits over the toilet. Your health care provider may recommend a sitz bath to help: Relieve pain and discomfort after delivering a baby. Relieve pain and itching from hemorrhoids or anal fissures. Relieve pain after certain surgeries. Relax muscles that are sore or tight. How to take a sitz bath Take 3-4 sitz baths a day, or as many as told by your health care provider. Bathtub sitz bath To take a sitz bath in a bathtub: Partially fill a bathtub with warm water. The water should be deep enough to cover your hips and buttocks when you are sitting in the tub. Follow your health  care provider's instructions if you are told to put medicine in the water. Sit in the water. Open the tub drain a little, and leave it open during your bath. Turn on the warm water again, enough to replace the water that is draining out. Keep the water running throughout your bath. This helps keep the water at the right level and temperature. Soak in the water for 15-20 minutes, or as long as told by your health care provider. When you are done, be careful when you stand up. You may feel dizzy. After the sitz bath, pat yourself dry. Do not rub your skin to dry it.  Over-the-toilet sitz bath To take a sitz bath with an over-the-toilet basin: Follow the manufacturer's instructions. Fill the basin with warm water. Follow your health care provider's instructions if you were told to put medicine in the water. Sit on the seat. Make sure the water covers your buttocks and perineum. Soak in the water for 15-20 minutes, or as long as told by your health care provider. After the sitz bath, pat yourself dry. Do not rub your skin to dry it. Clean and dry the basin between uses. Discard the basin if it cracks, or according to the manufacturer's instructions.  Contact a health care provider if: Your pain or itching gets worse. Do not continue with sitz baths if your symptoms get  worse. You have new symptoms. Do not continue with sitz baths until you talk with your health care provider. Summary A sitz bath is a warm water bath in which the water only comes up to your hips and covers your buttocks. A sitz bath may help relieve pain and discomfort after delivering a baby. It also may help with pain and itching from hemorrhoids or anal fissures, or pain after certain surgeries. It can also help to relax muscles that are sore or tight. Take 3-4 sitz baths a day, or as many as told by your health care provider. Soak in the water for 15-20 minutes. Do not continue with sitz baths if your symptoms get  worse. This information is not intended to replace advice given to you by your health care provider. Make sure you discuss any questions you have with your healthcare provider. Document Revised: 06/27/2020 Document Reviewed: 06/27/2020 Elsevier Patient Education  2022 [employer].

## 2021-05-27 NOTE — Progress Notes (Signed)
[facility] VISIT   Primary Care Provider Donita Brooks, [place] [address] [phone]  Patient Profile: Jon Robinson. is a 70 y.o. male with a pmh significant for diabetes, hypertension, hyperlipidemia, allergies, arthritis, anxiety, nephrolithiasis, diverticulosis (with prior hospitalization for presumed diverticular hemorrhage), hemorrhoids, GERD, chronic gastritis, colon polyps (TAs and hyperplastic), prior Candida esophagitis.  The patient presents to the [facility] for an evaluation and management of problem(s) noted below:  Problem List 1. Gastric ulcer without hemorrhage or perforation, unspecified chronicity   2. Chronic gastritis without bleeding, unspecified gastritis type   3. Other iron deficiency anemia   4. Gastroesophageal reflux disease without esophagitis   5. Candida esophagitis ([place])   6. Abscess of right thigh     History of Present Illness Please see initial consultation in outpatient clinic note for full details of HPI.  Interval History The patient returns for scheduled follow-up.  The patient has done well overall.  He was able to tolerate fluconazole treatment after recent endoscopy.  He wonders if there is any other testing to see if this is gone.  He denies any significant GERD symptoms at this time on current PPI therapy.  Denies any dysphagia symptoms as well.  In the past he has had skin abscesses that have come and gone for which she has soaked or use warm pads.  More recently over the last few weeks he has had increasing fluctuance in the region near his perineum and right thigh.  He had bloody output but that is improving at this time.  He denies any fevers or chills.  Has not prevented him from being able to walk and he has no radiation of pain or discomfort.  He has been doing Epson salt baths a few times over the course the last week with some good effect.  He  feels things are improving.  He has not had any recent antibiotics.  He did not discuss this with his PCP as he felt things were improving.  GI Review of Systems Positive as above Negative for odynophagia, melena, hematochezia/bloating/pain   Review of Systems General: Denies fevers/chills/weight loss unintentionally Cardiovascular: Denies chest pain/palpitations Pulmonary: Denies shortness of breath Gastroenterological: See HPI Genitourinary: Denies darkened urine Hematological: Denies easy bruising/bleeding Dermatological: Denies jaundice Psychological: Mood is stable   Medications Current Outpatient Medications  Medication Sig Dispense Refill   acetaminophen (TYLENOL) 325 MG tablet Take 325 mg by mouth every 6 (six) hours as needed (pain).     atorvastatin (LIPITOR) 20 MG tablet Take 20 mg by mouth daily.     diclofenac Sodium (VOLTAREN) 1 % GEL Apply 1 application topically 3 (three) times daily as needed (left thumb pain).     ferrous gluconate (FERGON) 324 MG tablet Take 1 tablet (324 mg total) by mouth daily with breakfast. 30 tablet 2   hydrochlorothiazide (HYDRODIURIL) 25 MG tablet Take 25 mg by mouth daily.     loratadine (CLARITIN) 10 MG tablet Take 10 mg by mouth daily.     metFORMIN (GLUCOPHAGE) 500 MG tablet Take 500 mg by mouth 3 (three) times daily.     omeprazole (PRILOSEC) 40 MG capsule Take 1 capsule (40 mg total) by mouth 2 (two) times daily. 60 capsule 4   Polyvinyl Alcohol-Povidone PF 1.4-0.6 % SOLN Place 1 drop into both eyes in the morning, at noon, and at bedtime.     senna-docusate (SENOKOT-S) 8.6-50 MG tablet Take 1 tablet by mouth daily.  sildenafil (VIAGRA) 100 MG tablet      simethicone (MYLICON) 80 MG chewable tablet Chew 80 mg by mouth at bedtime.     No current facility-administered medications for this visit.    Allergies Allergies  Allergen Reactions   Novocain [Procaine] Swelling    In the face   Penicillins Other (See Comments)     Childhood reaction    Histories Past Medical History:  Diagnosis Date   Allergy    seasonal   Anxiety    Arthritis    Blood transfusion without reported diagnosis    Cataract    Colon polyps    Diabetes mellitus without complication (HCC)    Diet controlled   Diverticulosis    GERD (gastroesophageal reflux disease)    Headache    History of kidney stones    Hypertension    Neuromuscular disorder ([place])    Finger tips numbness/tingling   Past Surgical History:  Procedure Laterality Date   COLONOSCOPY WITH PROPOFOL N/A 11/25/2020   Procedure: COLONOSCOPY WITH PROPOFOL;  Surgeon: Irving Copas., MD;  Location: [facility] ENDOSCOPY;  Service: Gastroenterology;  Laterality: N/A;   FOOT SURGERY Left    x 2   KIDNEY STONE SURGERY  10/1997   KNEE ARTHROPLASTY Right 12/30/2017   Procedure: RIGHT TOTAL KNEE ARTHROPLASTY;  Surgeon: Rod Can, MD;  Location: [facility] ORS;  Service: Orthopedics;  Laterality: Right;  Needs RNFA   WRIST FRACTURE SURGERY Right 1972   Social History   Socioeconomic History   Marital status: Widowed    Spouse name: Not on file   Number of children: 2   Years of education: Not on file   Highest education level: Not on file  Occupational History   Occupation: retired  Tobacco Use   Smoking status: Former    Packs/day: 0.50    Years: 25.00    Pack years: 12.50    Types: Cigarettes    Quit date: 12/31/2015    Years since quitting: 5.4   Smokeless tobacco: Never  Vaping Use   Vaping Use: Never used  Substance and Sexual Activity   Alcohol use: Yes    Alcohol/week: 2.0 - 3.0 standard drinks    Types: 1 - 2 Cans of beer, 1 Shots of liquor per week    Comment: Weekly   Drug use: Yes    Frequency: 8.0 times per week    Types: Heroin, "Crack" cocaine, Marijuana    Comment: 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, Uses Marijuana occasionally   Sexual activity: Not on file  Other Topics Concern   Not on file  Social History Narrative   Not on file   Social  Determinants of Health   Financial Resource Strain: Not on file  Food Insecurity: Not on file  Transportation Needs: Not on file  Physical Activity: Not on file  Stress: Not on file  Social Connections: Not on file  Intimate Partner Violence: Not on file   Family History  Problem Relation Age of Onset   Kidney failure Mother    Hypertension Mother    Diabetes Sister    Epilepsy Brother    Kidney failure Brother 40   Cancer Paternal Aunt        type unknown   Diabetes Sister    Diabetes Sister    Epilepsy Son    Bipolar disorder Son    Colon cancer Neg Hx    Esophageal cancer Neg Hx    Inflammatory bowel disease Neg Hx  Liver disease Neg Hx    Pancreatic cancer Neg Hx    Rectal cancer Neg Hx    Stomach cancer Neg Hx    Colon polyps Neg Hx    I have reviewed his medical, social, and family history in detail and updated the electronic medical record as necessary.    PHYSICAL EXAMINATION  BP 132/80   Pulse 66   Ht '5\' 9"'$  (1.753 m)   Wt 204 lb (92.5 kg)   BMI 30.13 kg/m  Wt Readings from Last 3 Encounters:  05/27/21 204 lb (92.5 kg)  03/04/21 217 lb (98.4 kg)  01/07/21 217 lb 2 oz (98.5 kg)  GEN: NAD, appears stated age, doesn't appear chronically ill PSYCH: Cooperative, without pressured speech EYE: Conjunctivae pink, sclerae anicteric ENT: Masked CV: Nontachycardic RESP: No audible wheezing GI: NABS, soft, protuberant abdomen, rounded, nontender, without rebound or guarding GU: No evidence of a perineal abscess MSK/EXT: Right thigh abscess cavity with 2 areas of recent drainage and mild fluctuance but no pain or discomfort or drainage noted, this area is pink and but not red and not hot to touch; no lower extremity edema SKIN: No jaundice NEURO:  Alert & Oriented x 3, no focal deficits   REVIEW OF DATA  I reviewed the following data at the time of this encounter:  GI Procedures and Studies  May 2022 colonoscopy - Hemorrhoids found on digital rectal  exam. - Four 2 to 5 mm polyps at the recto-sigmoid colon and in the descending colon, removed with a cold snare. Resected and retrieved. - Diverticulosis in the entire examined colon. - Normal mucosa in the entire examined colon otherwise. - Non-bleeding non-thrombosed external and internal hemorrhoids.  May 2022 EGD - No gross lesions in esophagus proximally. White nummular lesions in esophageal mucosa distally - biopsied. - Non-obstructing Schatzki ring. - Z-line regular, 40 cm from the incisors. - 3 cm hiatal hernia. - Gastritis. Biopsied. - No gross lesions in the duodenal bulb, in the first portion of the duodenum and in the second portion of the duodenum. Biopsied.  Pathology Diagnosis 1. Surgical [P], duodenal - UNREMARKABLE DUODENAL MUCOSA. - NO FEATURES OF CELIAC SPRUE OR GRANULOMAS. - 2. Surgical [P], random sites gastric - GASTRIC ANTRAL AND OXYNTIC MUCOSA WITH SLIGHT CHRONIC INFLAMMATION. [place] NEGATIVE FOR HELICOBACTER PYLORI. - NO INTESTINAL METAPLASIA, DYSPLASIA OR CARCINOMA. 3. Surgical [P], esophageal - INFLAMED SQUAMOUS MUCOSA WITH FUNGUS CONSISTENT WITH CANDIDA. - PAS STAIN POSITIVE FOR FUNGUS. - NO DYSPLASIA OR CARCINOMA. 4. Surgical [P], colon, descending, rectosigmoid, polyp (4) - HYPERPLASTIC POLYP (FOUR). - NO ADENOMATOUS CHANGE OR CARCINOMA.  Laboratory Studies  Reviewed those in epic  Imaging Studies  No new imaging studies to review   ASSESSMENT  Mr. Taitano is a 70 y.o. male with a pmh significant for diabetes, hypertension, hyperlipidemia, allergies, arthritis, anxiety, nephrolithiasis, diverticulosis (with prior hospitalization for presumed diverticular hemorrhage), hemorrhoids, GERD, chronic gastritis, colon polyps (TAs and hyperplastic), prior Candida esophagitis.  The patient is seen today for evaluation and management of:  1. Gastric ulcer without hemorrhage or perforation, unspecified chronicity   2. Chronic gastritis without  bleeding, unspecified gastritis type   3. Other iron deficiency anemia   4. Gastroesophageal reflux disease without esophagitis   5. Candida esophagitis ([place])   6. Abscess of right thigh    The patient is hemodynamically stable.  From a GI standpoint he is clinically well.  Patient seems to have done well with his PPI therapy and his GERD is well  controlled at this time.  We will decrease his PPI to once daily dosing.  He will maintain this.  After an extensive discussion, and due to patient finances, we are going to try to minimize endoscopic procedures if possible.  However, if the patient is found to have iron deficiency then repeat endoscopic evaluation will need to be considered with a small bowel enteroscopy and potential video capsule endoscopy.  Time will tell and we will review and see how his labs look today to help determine this.  His bowel movements are normal at this time and he is using fiber supplementation as needed.  In regards to the patient's thigh abscess seems to be improving and I have asked the patient to continue sitz bath's/Epson salt baths in an effort of trying to help this heal further.  If things progress he certainly can let us know and I would send antibiotics (Cipro/Flagyl) for short course until he can be seen by his PCP or a general surgeon who can consider further debridement.  He wants to hold on this at this time because he feels things are improving but he will let his PCP know if things change for [place] if they change dramatically in the coming days.  All patient questions were answered to the best of my ability, and the patient agrees to the aforementioned plan of action with follow-up as indicated.   PLAN  Laboratories as outlined below SBE +/- video capsule endoscopy to be considered if patient remains iron deficient Decrease PPI to once daily Colonoscopy recall for surveillance in 7 years Hemoglobin A1c to be drawn to evaluate how patient's lifestyle  modifications have made differences for him   Orders Placed This Encounter  Procedures   CBC   IBC + Ferritin   HgB A1c     New Prescriptions   No medications on file   Modified Medications   No medications on file    Planned Follow Up No follow-ups on file.   Total Time in Face-to-Face and in Coordination of Care for patient including independent/personal interpretation/review of prior testing, medical history, examination, medication adjustment, communicating results with the patient directly, and documentation with the EHR is 25 minutes.  Justice Britain, MD [facility] Office # [phone]

## 2021-06-12 ENCOUNTER — Encounter: Payer: No Typology Code available for payment source | Admitting: Gastroenterology

## 2021-11-12 ENCOUNTER — Encounter: Payer: Self-pay | Admitting: Gastroenterology

## 2021-12-30 ENCOUNTER — Ambulatory Visit: Payer: No Typology Code available for payment source | Admitting: Gastroenterology

## 2022-12-22 ENCOUNTER — Inpatient Hospital Stay (HOSPITAL_COMMUNITY)
Admission: EM | Admit: 2022-12-22 | Discharge: 2022-12-24 | DRG: 641 | Disposition: A | Discharge status: Home / Self Care | Payer: No Typology Code available for payment source | Attending: Internal Medicine | Admitting: Internal Medicine

## 2022-12-22 DIAGNOSIS — Z8249 Family history of ischemic heart disease and other diseases of the circulatory system: Secondary | ICD-10-CM

## 2022-12-22 DIAGNOSIS — K582 Mixed irritable bowel syndrome: Secondary | ICD-10-CM | POA: Diagnosis present

## 2022-12-22 DIAGNOSIS — E876 Hypokalemia: Secondary | ICD-10-CM | POA: Diagnosis present

## 2022-12-22 DIAGNOSIS — E860 Dehydration: Secondary | ICD-10-CM | POA: Diagnosis not present

## 2022-12-22 DIAGNOSIS — Z87891 Personal history of nicotine dependence: Secondary | ICD-10-CM

## 2022-12-22 DIAGNOSIS — I5032 Chronic diastolic (congestive) heart failure: Secondary | ICD-10-CM | POA: Diagnosis present

## 2022-12-22 DIAGNOSIS — E785 Hyperlipidemia, unspecified: Secondary | ICD-10-CM | POA: Diagnosis present

## 2022-12-22 DIAGNOSIS — R197 Diarrhea, unspecified: Secondary | ICD-10-CM | POA: Diagnosis not present

## 2022-12-22 DIAGNOSIS — Z888 Allergy status to other drugs, medicaments and biological substances status: Secondary | ICD-10-CM

## 2022-12-22 DIAGNOSIS — K259 Gastric ulcer, unspecified as acute or chronic, without hemorrhage or perforation: Secondary | ICD-10-CM | POA: Diagnosis present

## 2022-12-22 DIAGNOSIS — E119 Type 2 diabetes mellitus without complications: Secondary | ICD-10-CM | POA: Diagnosis present

## 2022-12-22 DIAGNOSIS — R55 Syncope and collapse: Secondary | ICD-10-CM

## 2022-12-22 DIAGNOSIS — I110 Hypertensive heart disease with heart failure: Secondary | ICD-10-CM | POA: Diagnosis present

## 2022-12-22 DIAGNOSIS — Z880 Allergy status to penicillin: Secondary | ICD-10-CM

## 2022-12-22 DIAGNOSIS — Z820 Family history of epilepsy and other diseases of the nervous system: Secondary | ICD-10-CM

## 2022-12-22 DIAGNOSIS — D649 Anemia, unspecified: Secondary | ICD-10-CM | POA: Diagnosis present

## 2022-12-22 DIAGNOSIS — K219 Gastro-esophageal reflux disease without esophagitis: Secondary | ICD-10-CM | POA: Diagnosis present

## 2022-12-22 DIAGNOSIS — N179 Acute kidney failure, unspecified: Secondary | ICD-10-CM | POA: Diagnosis not present

## 2022-12-22 DIAGNOSIS — Z8719 Personal history of other diseases of the digestive system: Secondary | ICD-10-CM

## 2022-12-22 DIAGNOSIS — Z841 Family history of disorders of kidney and ureter: Secondary | ICD-10-CM

## 2022-12-22 DIAGNOSIS — Z79899 Other long term (current) drug therapy: Secondary | ICD-10-CM

## 2022-12-22 DIAGNOSIS — R112 Nausea with vomiting, unspecified: Secondary | ICD-10-CM

## 2022-12-22 DIAGNOSIS — Z7984 Long term (current) use of oral hypoglycemic drugs: Secondary | ICD-10-CM

## 2022-12-22 DIAGNOSIS — Z833 Family history of diabetes mellitus: Secondary | ICD-10-CM

## 2022-12-22 DIAGNOSIS — F419 Anxiety disorder, unspecified: Secondary | ICD-10-CM | POA: Diagnosis present

## 2022-12-22 DIAGNOSIS — I499 Cardiac arrhythmia, unspecified: Secondary | ICD-10-CM | POA: Diagnosis present

## 2022-12-22 LAB — CBC WITH DIFFERENTIAL/PLATELET
Abs Immature Granulocytes: 0.04 10*3/uL (ref 0.00–0.07)
Basophils Absolute: 0.1 10*3/uL (ref 0.0–0.1)
Basophils Relative: 1 %
Eosinophils Absolute: 0.1 10*3/uL (ref 0.0–0.5)
Eosinophils Relative: 1 %
HCT: 40.4 % (ref 39.0–52.0)
Hemoglobin: 12.9 g/dL — ABNORMAL LOW (ref 13.0–17.0)
Immature Granulocytes: 1 %
Lymphocytes Relative: 23 %
Lymphs Abs: 1.9 10*3/uL (ref 0.7–4.0)
MCH: 25.7 pg — ABNORMAL LOW (ref 26.0–34.0)
MCHC: 31.9 g/dL (ref 30.0–36.0)
MCV: 80.5 fL (ref 80.0–100.0)
Monocytes Absolute: 0.9 10*3/uL (ref 0.1–1.0)
Monocytes Relative: 11 %
Neutro Abs: 5.2 10*3/uL (ref 1.7–7.7)
Neutrophils Relative %: 63 %
Platelets: 282 10*3/uL (ref 150–400)
RBC: 5.02 MIL/uL (ref 4.22–5.81)
RDW: 16.2 % — ABNORMAL HIGH (ref 11.5–15.5)
WBC: 8.2 10*3/uL (ref 4.0–10.5)
nRBC: 0 % (ref 0.0–0.2)

## 2022-12-22 LAB — COMPREHENSIVE METABOLIC PANEL
ALT: 16 U/L (ref 0–44)
AST: 27 U/L (ref 15–41)
Albumin: 4 g/dL (ref 3.5–5.0)
Alkaline Phosphatase: 74 U/L (ref 38–126)
Anion gap: 18 — ABNORMAL HIGH (ref 5–15)
BUN: 26 mg/dL — ABNORMAL HIGH (ref 8–23)
CO2: 20 mmol/L — ABNORMAL LOW (ref 22–32)
Calcium: 9.3 mg/dL (ref 8.9–10.3)
Chloride: 100 mmol/L (ref 98–111)
Creatinine, Ser: 2.93 mg/dL — ABNORMAL HIGH (ref 0.61–1.24)
GFR, Estimated: 22 mL/min — ABNORMAL LOW (ref >60)
Glucose, Bld: 161 mg/dL — ABNORMAL HIGH (ref 70–99)
Potassium: 3.4 mmol/L — ABNORMAL LOW (ref 3.5–5.1)
Sodium: 138 mmol/L (ref 135–145)
Total Bilirubin: 0.6 mg/dL (ref 0.3–1.2)
Total Protein: 7.2 g/dL (ref 6.5–8.1)

## 2022-12-22 LAB — URINALYSIS, ROUTINE W REFLEX MICROSCOPIC
Bilirubin Urine: NEGATIVE
Glucose, UA: NEGATIVE mg/dL
Ketones, ur: NEGATIVE mg/dL
Nitrite: NEGATIVE
Protein, ur: 100 mg/dL — AB
Specific Gravity, Urine: >1.03 — ABNORMAL HIGH (ref 1.005–1.030)
pH: 5.5 (ref 5.0–8.0)

## 2022-12-22 LAB — URINALYSIS, MICROSCOPIC (REFLEX)

## 2022-12-22 LAB — CREATININE, SERUM
Creatinine, Ser: 3.01 mg/dL — ABNORMAL HIGH (ref 0.61–1.24)
GFR, Estimated: 22 mL/min — ABNORMAL LOW (ref >60)

## 2022-12-22 LAB — TROPONIN I (HIGH SENSITIVITY)
Troponin I (High Sensitivity): 28 ng/L — ABNORMAL HIGH (ref <18)
Troponin I (High Sensitivity): 26 ng/L — ABNORMAL HIGH (ref <18)

## 2022-12-22 LAB — CBG MONITORING, ED
Glucose-Capillary: 132 mg/dL — ABNORMAL HIGH (ref 70–99)
Glucose-Capillary: 184 mg/dL — ABNORMAL HIGH (ref 70–99)

## 2022-12-22 LAB — TSH: TSH: 1.748 u[IU]/mL (ref 0.350–4.500)

## 2022-12-22 LAB — POC OCCULT BLOOD, ED: Fecal Occult Bld: NEGATIVE

## 2022-12-22 MED ORDER — POTASSIUM CHLORIDE CRYS ER 20 MEQ PO TBCR
40.0000 meq | EXTENDED_RELEASE_TABLET | Freq: Once | ORAL | Status: AC
  Administered 2022-12-22: 40 meq via ORAL
  Filled 2022-12-22: qty 2

## 2022-12-22 MED ORDER — LOPERAMIDE HCL 2 MG PO CAPS
4.0000 mg | ORAL_CAPSULE | Freq: Once | ORAL | Status: AC
  Administered 2022-12-22: 4 mg via ORAL
  Filled 2022-12-22: qty 2

## 2022-12-22 MED ORDER — LACTATED RINGERS IV BOLUS
1000.0000 mL | Freq: Once | INTRAVENOUS | Status: AC
  Administered 2022-12-22: 1000 mL via INTRAVENOUS

## 2022-12-22 MED ORDER — INSULIN ASPART 100 UNIT/ML IJ SOLN
0.0000 [IU] | Freq: Three times a day (TID) | INTRAMUSCULAR | Status: DC
  Administered 2022-12-22 – 2022-12-24 (×2): 1 [IU] via SUBCUTANEOUS
  Administered 2022-12-24: 2 [IU] via SUBCUTANEOUS

## 2022-12-22 MED ORDER — HYDRALAZINE HCL 20 MG/ML IJ SOLN
10.0000 mg | Freq: Four times a day (QID) | INTRAMUSCULAR | Status: DC | PRN
  Administered 2022-12-23: 10 mg via INTRAVENOUS
  Filled 2022-12-22: qty 1

## 2022-12-22 MED ORDER — ONDANSETRON HCL 4 MG PO TABS: 4.0000 mg | ORAL_TABLET | Freq: Four times a day (QID) | ORAL | Status: DC | PRN

## 2022-12-22 MED ORDER — ACETAMINOPHEN 650 MG RE SUPP: 650.0000 mg | Freq: Four times a day (QID) | RECTAL | Status: DC | PRN

## 2022-12-22 MED ORDER — LOPERAMIDE HCL 2 MG PO CAPS: 2.0000 mg | ORAL_CAPSULE | Freq: Four times a day (QID) | ORAL | Status: DC | PRN

## 2022-12-22 MED ORDER — ONDANSETRON HCL 4 MG/2ML IJ SOLN: 4.0000 mg | Freq: Four times a day (QID) | INTRAMUSCULAR | Status: DC | PRN

## 2022-12-22 MED ORDER — ATORVASTATIN CALCIUM 10 MG PO TABS
20.0000 mg | ORAL_TABLET | Freq: Every day | ORAL | Status: DC
  Administered 2022-12-23 – 2022-12-24 (×2): 20 mg via ORAL
  Filled 2022-12-22 (×2): qty 2

## 2022-12-22 MED ORDER — LACTATED RINGERS IV SOLN: INTRAVENOUS | Status: DC

## 2022-12-22 MED ORDER — FERROUS GLUCONATE 324 (38 FE) MG PO TABS
324.0000 mg | ORAL_TABLET | Freq: Every day | ORAL | Status: DC
  Administered 2022-12-23 – 2022-12-24 (×2): 324 mg via ORAL
  Filled 2022-12-22 (×3): qty 1

## 2022-12-22 MED ORDER — HEPARIN SODIUM (PORCINE) 5000 UNIT/ML IJ SOLN
5000.0000 [IU] | Freq: Three times a day (TID) | INTRAMUSCULAR | Status: DC
  Administered 2022-12-22 – 2022-12-24 (×5): 5000 [IU] via SUBCUTANEOUS
  Filled 2022-12-22 (×5): qty 1

## 2022-12-22 MED ORDER — ACETAMINOPHEN 325 MG PO TABS: 650.0000 mg | ORAL_TABLET | Freq: Four times a day (QID) | ORAL | Status: DC | PRN

## 2022-12-22 MED ORDER — PANTOPRAZOLE SODIUM 40 MG PO TBEC
40.0000 mg | DELAYED_RELEASE_TABLET | Freq: Every day | ORAL | Status: DC
  Administered 2022-12-22 – 2022-12-24 (×3): 40 mg via ORAL
  Filled 2022-12-22 (×3): qty 1

## 2022-12-22 NOTE — ED Provider Notes (Signed)
[facility] Provider Note   CSN: [id number] Arrival date & time: 12/22/22  1400     History  Chief Complaint  Patient presents with   Loss of Consciousness    Jon Robinson. is a 72 y.o. male.  Pt arrives via EMS after syncopal event at orthotics store.  Pt was noted to complain of diarrhea, nausea x emesis x 1, and diaphoresis. No sz activity noted. No post syncope confusion, pt alert, oriented. Pt denies other recent syncope. Denies any associated chest pain or discomfort. No palpitations. No abd pain. Indicates had diarrhea for past few days, watery, gray/green in color. Denies any melena or rectal bleeding. No bloody emesis. No known ill contacts, travel, bad food ingestion or recent antibiotic use. No fever or chills. Denies dysuria or gu c/o. No fever or chills. Denies neck, back, spine or extremity pain or injury. Denies change in meds/new meds.   The history is provided by the patient, medical records and the EMS personnel.  Loss of Consciousness Associated symptoms: nausea and vomiting   Associated symptoms: no chest pain, no confusion, no fever, no headaches, no palpitations, no shortness of breath and no weakness        Home Medications Prior to Admission medications   Medication Sig Start Date End Date Taking? Authorizing Provider  acetaminophen (TYLENOL) 325 MG tablet Take 325 mg by mouth every 6 (six) hours as needed (pain).    [provider]  atorvastatin (LIPITOR) 20 MG tablet Take 20 mg by mouth daily.    [provider]  diclofenac Sodium (VOLTAREN) 1 % GEL Apply 1 application topically 3 (three) times daily as needed (left thumb pain).    [provider]  ferrous gluconate (FERGON) 324 MG tablet Take 1 tablet (324 mg total) by mouth daily with breakfast. 01/13/21   Mansouraty, Telford Nab., MD  hydrochlorothiazide (HYDRODIURIL) 25 MG tablet Take 25 mg by mouth daily.    [provider]  loratadine (CLARITIN) 10 MG tablet Take 10 mg by mouth daily.    [provider]  metFORMIN (GLUCOPHAGE) 500 MG tablet Take 500 mg by mouth 3 (three) times daily.    [provider]  omeprazole (PRILOSEC) 40 MG capsule Take 1 capsule (40 mg total) by mouth 2 (two) times daily. 03/04/21   Mansouraty, Telford Nab., MD  Polyvinyl Alcohol-Povidone PF 1.4-0.6 % SOLN Place 1 drop into both eyes in the morning, at noon, and at bedtime. 12/05/20   [provider]  senna-docusate (SENOKOT-S) 8.6-50 MG tablet Take 1 tablet by mouth daily.    [provider]  sildenafil (VIAGRA) 100 MG tablet  12/05/20   [provider]  simethicone (MYLICON) 80 MG chewable tablet Chew 80 mg by mouth at bedtime.    [provider]      Allergies    Novocain [procaine] and Penicillins    Review of Systems   Review of Systems  Constitutional:  Negative for chills and fever.  HENT:  Negative for sore throat.   Eyes:  Negative for redness.  Respiratory:  Negative for cough and shortness of breath.   Cardiovascular:  Positive for syncope. Negative for chest pain, palpitations and leg swelling.  Gastrointestinal:  Positive for diarrhea, nausea and vomiting. Negative for abdominal pain and blood in stool.  Genitourinary:  Negative for dysuria and flank pain.  Musculoskeletal:  Negative for back pain and neck pain.  Skin:  Negative for rash.  Neurological:  Negative for speech difficulty, weakness, numbness and headaches.  Hematological:  Does not bruise/bleed easily.  Psychiatric/Behavioral:  Negative for confusion.     Physical Exam Updated Vital Signs BP (!) 112/59 (BP Location: Right Arm)   Pulse 88   Temp 97.8 F (36.6 C) (Oral)   Resp 12   Ht 1.753 m ('5\' 9"'$ )   Wt 90.7 kg   SpO2 96%   BMI 29.53 kg/m  Physical Exam Vitals and nursing note reviewed.  Constitutional:      Appearance: Normal appearance. He is well-developed.  HENT:     Head:  Atraumatic.     Nose: Nose normal.     Mouth/Throat:     Mouth: Mucous membranes are moist.     Pharynx: Oropharynx is clear.  Eyes:     General: No scleral icterus.    Conjunctiva/sclera: Conjunctivae normal.     Pupils: Pupils are equal, round, and reactive to light.  Neck:     Vascular: No carotid bruit.     Trachea: No tracheal deviation.  Cardiovascular:     Rate and Rhythm: Normal rate and regular rhythm.     Pulses: Normal pulses.     Heart sounds: Normal heart sounds. No murmur heard.    No friction rub. No gallop.  Pulmonary:     Effort: Pulmonary effort is normal. No accessory muscle usage or respiratory distress.     Breath sounds: Normal breath sounds.  Chest:     Chest wall: No tenderness.  Abdominal:     General: Bowel sounds are normal. There is no distension.     Palpations: Abdomen is soft. There is no mass.     Tenderness: There is no abdominal tenderness. There is no guarding.  Genitourinary:    Comments: No cva tenderness. Loose greenish stool.  Musculoskeletal:        General: No swelling or tenderness.     Cervical back: Normal range of motion and neck supple. No rigidity or tenderness.     Right lower leg: No edema.     Left lower leg: No edema.     Comments: CTLS spine, non tender, aligned, no step off. No extremity pain or swelling. Distal pulses palp bil.   Skin:    General: Skin is warm and dry.     Findings: No rash.  Neurological:     Mental Status: He is alert.     Comments: Alert, speech clear. Motor/sens grossly intact bil. Steady gait.   Psychiatric:        Mood and Affect: Mood normal.     ED Results / Procedures / Treatments   Labs (all labs ordered are listed, but only abnormal results are displayed) Results for orders placed or performed during the hospital encounter of 12/22/22  CBC with Differential  Result Value Ref Range   WBC 8.2 4.0 - 10.5 K/uL   RBC 5.02 4.22 - 5.81 MIL/uL   Hemoglobin 12.9 (L) 13.0 - 17.0 g/dL   HCT  40.4 39.0 - 52.0 %   MCV 80.5 80.0 - 100.0 fL   MCH 25.7 (L) 26.0 - 34.0 pg   MCHC 31.9 30.0 - 36.0 g/dL   RDW 16.2 (H) 11.5 - 15.5 %   Platelets 282 150 - 400 K/uL   nRBC 0.0 0.0 - 0.2 %   Neutrophils Relative % 63 %   Neutro Abs 5.2 1.7 - 7.7 K/uL   Lymphocytes Relative 23 %   Lymphs Abs 1.9 0.7 -  4.0 K/uL   Monocytes Relative 11 %   Monocytes Absolute 0.9 0.1 - 1.0 K/uL   Eosinophils Relative 1 %   Eosinophils Absolute 0.1 0.0 - 0.5 K/uL   Basophils Relative 1 %   Basophils Absolute 0.1 0.0 - 0.1 K/uL   Immature Granulocytes 1 %   Abs Immature Granulocytes 0.04 0.00 - 0.07 K/uL  Comprehensive metabolic panel  Result Value Ref Range   Sodium 138 135 - 145 mmol/L   Potassium 3.4 (L) 3.5 - 5.1 mmol/L   Chloride 100 98 - 111 mmol/L   CO2 20 (L) 22 - 32 mmol/L   Glucose, Bld 161 (H) 70 - 99 mg/dL   BUN 26 (H) 8 - 23 mg/dL   Creatinine, Ser 2.93 (H) 0.61 - 1.24 mg/dL   Calcium 9.3 8.9 - 10.3 mg/dL   Total Protein 7.2 6.5 - 8.1 g/dL   Albumin 4.0 3.5 - 5.0 g/dL   AST 27 15 - 41 U/L   ALT 16 0 - 44 U/L   Alkaline Phosphatase 74 38 - 126 U/L   Total Bilirubin 0.6 0.3 - 1.2 mg/dL   GFR, Estimated 22 (L) >60 mL/min   Anion gap 18 (H) 5 - 15  POC occult blood, ED Provider will collect  Result Value Ref Range   Fecal Occult Bld NEGATIVE NEGATIVE  Troponin I (High Sensitivity)  Result Value Ref Range   Troponin I (High Sensitivity) 28 (H) <18 ng/L    EKG EKG Interpretation  Date/Time:  Tuesday December 22 2022 14:24:27 EST Ventricular Rate:  79 PR Interval:  183 QRS Duration: 108 QT Interval:  410 QTC Calculation: 470 R Axis:   54 Text Interpretation: Sinus rhythm Premature atrial complexes Confirmed by Lajean Saver [phone]) on 12/22/2022 2:45:53 PM  Radiology No results found.  Procedures Procedures    Medications Ordered in ED Medications  lactated ringers bolus 1,000 mL (has no administration in time range)  potassium chloride SA (KLOR-CON M) CR tablet 40 mEq  (has no administration in time range)  lactated ringers bolus 1,000 mL (1,000 mLs Intravenous New Bag/Given 12/22/22 1516)    ED Course/ Medical Decision Making/ A&P                             Medical Decision Making Problems Addressed: AKI (acute kidney injury) [facility]): acute illness or injury with systemic symptoms that poses a threat to life or bodily functions Dehydration: acute illness or injury with systemic symptoms that poses a threat to life or bodily functions Diarrhea, unspecified type: acute illness or injury with systemic symptoms that poses a threat to life or bodily functions Nausea and vomiting in adult: acute illness or injury with systemic symptoms Syncope and collapse: acute illness or injury with systemic symptoms that poses a threat to life or bodily functions  Amount and/or Complexity of Data Reviewed Independent Historian: EMS    Details: hx External Data Reviewed: notes. Labs: ordered. Decision-making details documented in ED Course. ECG/medicine tests: ordered and independent interpretation performed. Decision-making details documented in ED Course.  Risk Prescription drug management. Decision regarding hospitalization.   Iv ns. Continuous pulse ox and cardiac monitoring. Labs ordered/sent.   Differential diagnosis includes anemia/gi bleeding, aki/dehydration, syncope of various causes, etc . Dispo decision including potential need for admission considered - will get labs and reassess.   Reviewed nursing notes and prior charts for additional history. External reports reviewed. Additional history from: EMS.  Cardiac monitor: sinus rhythm, rate 88.  LR bolus iv.   Labs reviewed/interpreted by me - aki. Additional ivf/LR bolus. K low, kcl po. Wbc and hct normal.   Given syncope, dehydration, AKI, will admit.   Medicine consulted for admission.           Final Clinical Impression(s) / ED Diagnoses Final diagnoses:  None    Rx / DC  Orders ED Discharge Orders     None         Lajean Saver, MD 12/22/22 1626

## 2022-12-22 NOTE — ED Triage Notes (Signed)
Patient arrived to the ED via EMS from outpatient orthotics provider. Patient states he lives in a transitions house. EMS states patient had a syncopal episode lasting 1 minute. Patient does not remember. Patient vomited and had feces on himself. EMS states patient was diaphoretic and had complaints of nausea. EMS administered 4 mg zofran and 150 mL NS. Patient not diaphoretic at this time. Patient confused at this time.

## 2022-12-22 NOTE — ED Notes (Signed)
ED TO INPATIENT HANDOFF REPORT  ED Nurse Name and Phone #: Su Grand [phone]  S Name/Age/Gender Jon Robinson. 72 y.o. male Room/Bed: 038C/038C  Code Status   Code Status: Full Code  Home/SNF/Other Home Patient oriented to: self, place, time, and situation Is this baseline? Yes   Triage Complete: Triage complete  Chief Complaint Syncope [R55]  Triage Note Patient arrived to the ED via EMS from outpatient orthotics provider. Patient states he lives in a transitions house. EMS states patient had a syncopal episode lasting 1 minute. Patient does not remember. Patient vomited and had feces on himself. EMS states patient was diaphoretic and had complaints of nausea. EMS administered 4 mg zofran and 150 mL NS. Patient not diaphoretic at this time. Patient confused at this time.   Allergies Allergies  Allergen Reactions   Novocain [Procaine] Swelling    In the face   Penicillins Other (See Comments)    Childhood reaction    Level of Care/Admitting Diagnosis ED Disposition     ED Disposition  Admit   Condition  --   [place]: [place] [100100]  Level of Care: Telemetry Cardiac [103]  May place patient in observation at [facility] or [place] if equivalent level of care is available:: No  Covid Evaluation: Asymptomatic - no recent exposure (last 10 days) testing not required  Diagnosis: Syncope [206001]  Admitting Physician: Octavio Graves  Attending Physician: Octavio Graves          B Medical/Surgery History Past Medical History:  Diagnosis Date   Allergy    seasonal   Anxiety    Arthritis    Blood transfusion without reported diagnosis    Cataract    Colon polyps    Diabetes mellitus without complication ([place])    Diet controlled   Diverticulosis    GERD (gastroesophageal reflux disease)    Headache    History of kidney stones    Hypertension    Neuromuscular disorder ([place])    Finger tips  numbness/tingling   Past Surgical History:  Procedure Laterality Date   COLONOSCOPY WITH PROPOFOL N/A 11/25/2020   Procedure: COLONOSCOPY WITH PROPOFOL;  Surgeon: Irving Copas., MD;  Location: [facility] ENDOSCOPY;  Service: Gastroenterology;  Laterality: N/A;   FOOT SURGERY Left    x 2   KIDNEY STONE SURGERY  10/1997   KNEE ARTHROPLASTY Right 12/30/2017   Procedure: RIGHT TOTAL KNEE ARTHROPLASTY;  Surgeon: Rod Can, MD;  Location: [facility] ORS;  Service: Orthopedics;  Laterality: Right;  Needs RNFA   WRIST FRACTURE SURGERY Right 1972     A IV Location/Drains/Wounds Patient Lines/Drains/Airways Status     Active Line/Drains/Airways     Name Placement date Placement time Site Days   Peripheral IV 20 G Left Antecubital --  --  Antecubital  --   Peripheral IV 12/22/22 20 G Right Antecubital 12/22/22  1335  Antecubital  less than 1   Peripheral IV 12/22/22 18 G Anterior;Left Forearm 12/22/22  1340  Forearm  less than 1   Incision (Closed) 12/30/17 Knee Right 12/30/17  1506  -- 1818            Intake/Output Last 24 hours  Intake/Output Summary (Last 24 hours) at 12/22/2022 1841 Last data filed at 12/22/2022 1634 Gross per 24 hour  Intake 1000.54 ml  Output --  Net 1000.54 ml    Labs/Imaging Results for orders placed or performed during the hospital encounter of 12/22/22 (from  the past 48 hour(s))  CBC with Differential     Status: Abnormal   Collection Time: 12/22/22  2:35 PM  Result Value Ref Range   WBC 8.2 4.0 - 10.5 K/uL   RBC 5.02 4.22 - 5.81 MIL/uL   Hemoglobin 12.9 (L) 13.0 - 17.0 g/dL   HCT 40.4 39.0 - 52.0 %   MCV 80.5 80.0 - 100.0 fL   MCH 25.7 (L) 26.0 - 34.0 pg   MCHC 31.9 30.0 - 36.0 g/dL   RDW 16.2 (H) 11.5 - 15.5 %   Platelets 282 150 - 400 K/uL   nRBC 0.0 0.0 - 0.2 %   Neutrophils Relative % 63 %   Neutro Abs 5.2 1.7 - 7.7 K/uL   Lymphocytes Relative 23 %   Lymphs Abs 1.9 0.7 - 4.0 K/uL   Monocytes Relative 11 %   Monocytes Absolute 0.9 0.1 - 1.0  K/uL   Eosinophils Relative 1 %   Eosinophils Absolute 0.1 0.0 - 0.5 K/uL   Basophils Relative 1 %   Basophils Absolute 0.1 0.0 - 0.1 K/uL   Immature Granulocytes 1 %   Abs Immature Granulocytes 0.04 0.00 - 0.07 K/uL    Comment: Performed at [facility], [address] [place], [place] [address]  Comprehensive metabolic panel     Status: Abnormal   Collection Time: 12/22/22  2:35 PM  Result Value Ref Range   Sodium 138 135 - 145 mmol/L   Potassium 3.4 (L) 3.5 - 5.1 mmol/L   Chloride 100 98 - 111 mmol/L   CO2 20 (L) 22 - 32 mmol/L   Glucose, Bld 161 (H) 70 - 99 mg/dL    Comment: Glucose reference range applies only to samples taken after fasting for at least 8 hours.   BUN 26 (H) 8 - 23 mg/dL   Creatinine, Ser 2.93 (H) 0.61 - 1.24 mg/dL   Calcium 9.3 8.9 - 10.3 mg/dL   Total Protein 7.2 6.5 - 8.1 g/dL   Albumin 4.0 3.5 - 5.0 g/dL   AST 27 15 - 41 U/L   ALT 16 0 - 44 U/L   Alkaline Phosphatase 74 38 - 126 U/L   Total Bilirubin 0.6 0.3 - 1.2 mg/dL   GFR, Estimated 22 (L) >60 mL/min    Comment: (NOTE) Calculated using the CKD-EPI Creatinine Equation (2021)    Anion gap 18 (H) 5 - 15    Comment: Performed at [facility] [address] [place], [place] [address]  Troponin I (High Sensitivity)     Status: Abnormal   Collection Time: 12/22/22  2:35 PM  Result Value Ref Range   Troponin I (High Sensitivity) 28 (H) <18 ng/L    Comment: (NOTE) Elevated high sensitivity troponin I (hsTnI) values and significant  changes across serial measurements may suggest ACS but many other  chronic and acute conditions are known to elevate hsTnI results.  Refer to the "Links" section for chest pain algorithms and additional  guidance. Performed at [facility], [place] [address], [place], [place] [address]   POC occult blood, ED Provider will collect     Status: None   Collection Time: 12/22/22  3:00 PM  Result Value Ref Range   Fecal Occult Bld NEGATIVE NEGATIVE  Troponin  I (High Sensitivity)     Status: Abnormal   Collection Time: 12/22/22  4:46 PM  Result Value Ref Range   Troponin I (High Sensitivity) 26 (H) <18 ng/L  Comment: (NOTE) Elevated high sensitivity troponin I (hsTnI) values and significant  changes across serial measurements may suggest ACS but many other  chronic and acute conditions are known to elevate hsTnI results.  Refer to the "Links" section for chest pain algorithms and additional  guidance. Performed at [facility], [place] [address], [place], [place] [address]   Creatinine, serum     Status: Abnormal   Collection Time: 12/22/22  4:46 PM  Result Value Ref Range   Creatinine, Ser 3.01 (H) 0.61 - 1.24 mg/dL   GFR, Estimated 22 (L) >60 mL/min    Comment: (NOTE) Calculated using the CKD-EPI Creatinine Equation (2021) Performed at [facility] [address], [place], [place] [address]   TSH     Status: None   Collection Time: 12/22/22  4:46 PM  Result Value Ref Range   TSH 1.748 0.350 - 4.500 uIU/mL    Comment: Performed by a 3rd Generation assay with a functional sensitivity of <=0.01 uIU/mL. Performed at [facility], [place] [address] [place], [place] [address]   CBG monitoring, ED     Status: Abnormal   Collection Time: 12/22/22  5:22 PM  Result Value Ref Range   Glucose-Capillary 132 (H) 70 - 99 mg/dL    Comment: Glucose reference range applies only to samples taken after fasting for at least 8 hours.  Urinalysis, Routine w reflex microscopic -Urine, Clean Catch     Status: Abnormal   Collection Time: 12/22/22  5:30 PM  Result Value Ref Range   Color, Urine YELLOW YELLOW   APPearance CLEAR CLEAR   Specific Gravity, Urine >1.030 (H) 1.005 - 1.030   pH 5.5 5.0 - 8.0   Glucose, UA NEGATIVE NEGATIVE mg/dL   Hgb urine dipstick SMALL (A) NEGATIVE   Bilirubin Urine NEGATIVE NEGATIVE   Ketones, ur NEGATIVE NEGATIVE mg/dL   Protein, ur 100 (A) NEGATIVE mg/dL   Nitrite NEGATIVE NEGATIVE   Leukocytes,Ua  TRACE (A) NEGATIVE    Comment: Performed at [facility] [address] [place], [place] [address]  Urinalysis, Microscopic (reflex)     Status: Abnormal   Collection Time: 12/22/22  5:30 PM  Result Value Ref Range   RBC / HPF 0-5 0 - 5 RBC/hpf   WBC, UA 0-5 0 - 5 WBC/hpf   Bacteria, UA RARE (A) NONE SEEN   Squamous Epithelial / HPF 0-5 0 - 5 /HPF   Mucus PRESENT    Hyaline Casts, UA PRESENT     Comment: Performed at [facility], [place] [address] [place], [place] [address]   No results found.  Pending Labs Unresulted Labs (From admission, onward)     Start     Ordered   12/23/22 0500  Brain natriuretic peptide  Daily,   R     Question:  Specimen collection method  Answer:  Lab=Lab collect   12/22/22 1640   12/23/22 [id number]  Basic metabolic panel  Daily,   R     Question:  Specimen collection method  Answer:  Lab=Lab collect   12/22/22 1640   12/23/22 0500  CBC with Differential/Platelet  Daily,   R     Question:  Specimen collection method  Answer:  Lab=Lab collect   12/22/22 1640   12/23/22 0500  Magnesium  Daily,   R     Question:  Specimen collection method  Answer:  Lab=Lab collect   12/22/22 1640   12/22/22 1729  Osmolality  Once,   R        12/22/22 1728   12/22/22 1729  Creatinine, urine, random  Once,   R        12/22/22 1728   12/22/22 1729  Sodium, urine, random  Once,   R        12/22/22 1728   12/22/22 1729  Osmolality, urine  Once,   R        12/22/22 1728   12/22/22 1729  Uric acid  Once,   R        12/22/22 1728   12/22/22 1729  Urea nitrogen, urine  Once,   R        12/22/22 1728   12/22/22 1641  Hemoglobin A1c  Add-on,   AD       Comments: To assess prior glycemic control    12/22/22 1640            Vitals/Pain Today's Vitals   12/22/22 1422 12/22/22 1428 12/22/22 1429 12/22/22 1829  BP:  (!) 112/59    Pulse:  88    Resp:  12    Temp:  97.8 F (36.6 C)  97.9 F (36.6 C)  TempSrc:  Oral  Oral  SpO2: 100% 96%    Weight:   90.7  kg   Height:   '5\' 9"'$  (1.753 m)   PainSc:   0-No pain     Isolation Precautions No active isolations  Medications Medications  lactated ringers infusion ( Intravenous New Bag/Given 12/22/22 1724)  atorvastatin (LIPITOR) tablet 20 mg (has no administration in time range)  ferrous gluconate (FERGON) tablet 324 mg (has no administration in time range)  pantoprazole (PROTONIX) EC tablet 40 mg (40 mg Oral Given 12/22/22 1720)  loperamide (IMODIUM) capsule 2 mg (has no administration in time range)  heparin injection 5,000 Units (has no administration in time range)  acetaminophen (TYLENOL) tablet 650 mg (has no administration in time range)    Or  acetaminophen (TYLENOL) suppository 650 mg (has no administration in time range)  ondansetron (ZOFRAN) tablet 4 mg (has no administration in time range)    Or  ondansetron (ZOFRAN) injection 4 mg (has no administration in time range)  insulin aspart (novoLOG) injection 0-9 Units (1 Units Subcutaneous Given 12/22/22 1726)  hydrALAZINE (APRESOLINE) injection 10 mg (has no administration in time range)  lactated ringers bolus 1,000 mL (0 mLs Intravenous Stopped 12/22/22 1634)  lactated ringers bolus 1,000 mL (1,000 mLs Intravenous New Bag/Given 12/22/22 1641)  potassium chloride SA (KLOR-CON M) CR tablet 40 mEq (40 mEq Oral Given 12/22/22 1641)  loperamide (IMODIUM) capsule 4 mg (4 mg Oral Given 12/22/22 1720)    Mobility walks     Focused Assessments Cardiac Assessment Handoff:    No results found for: "CKTOTAL", "CKMB", "CKMBINDEX", "TROPONINI" No results found for: "DDIMER" Does the Patient currently have chest pain? No    R Recommendations: See Admitting Provider Note  Report given to:   Additional Notes:

## 2022-12-22 NOTE — H&P (Addendum)
TRH H&P   Patient Demographics:    Jon Robinson, is a 72 y.o. male  MRN: [id number]   DOB - 13-Dec-1950  Admit Date - 12/22/2022  Outpatient Primary MD for the patient is Donita Brooks, MD    Patient coming from: [facility]  Chief Complaint  Patient presents with   Loss of Consciousness      HPI:    Jon Robinson  is a 72 y.o. male, with history of irritable bowel syndrome, diverticular bleed, gastritis, hypertension, dyslipidemia, osteoarthritis history of chronic nonspecific anemia, DM type II who has been having diarrhea for the last 1 week, he was feeling weak and dizzy for the last few days, today he was at an orthotic clinic to get a special shoe he became lightheaded and passed out was brought to the ER where he was diagnosed with dehydration, hypokalemia and AKI and admitted to the hospital.  Patient currently denies any headache, no fever or chills, no chest pain cough or shortness of breath, he has had no abdominal pain or discomfort, no dysuria, no blood in stool or urine, no mucus in stool, no recent exposure to antibiotics, he does take metformin which can cause diarrhea from time to time, denies exposure to any bad food items, no other close contacts have any diarrhea or gastroenteritis-like symptoms, denies any focal weakness.  He does have some generalized weakness which is improving since he has been started on IV fluids in the ER.   Review of systems:    A full 10 point Review of Systems was done, except as stated above, all other Review of Systems were negative.   With Past History of the following :    Past Medical History:  Diagnosis Date   Allergy    seasonal   Anxiety    Arthritis     Blood transfusion without reported diagnosis    Cataract    Colon polyps    Diabetes mellitus without complication ([place])    Diet controlled   Diverticulosis    GERD (gastroesophageal reflux disease)    Headache    History of kidney stones    Hypertension    Neuromuscular disorder ([place])    Finger tips numbness/tingling      Past Surgical History:  Procedure Laterality Date   COLONOSCOPY WITH PROPOFOL N/A 11/25/2020  Procedure: COLONOSCOPY WITH PROPOFOL;  Surgeon: Mansouraty, Telford Nab., MD;  Location: [facility];  Service: Gastroenterology;  Laterality: N/A;   FOOT SURGERY Left    x 2   KIDNEY STONE SURGERY  10/1997   KNEE ARTHROPLASTY Right 12/30/2017   Procedure: RIGHT TOTAL KNEE ARTHROPLASTY;  Surgeon: Rod Can, MD;  Location: [facility];  Service: Orthopedics;  Laterality: Right;  Needs RNFA   WRIST FRACTURE SURGERY Right 1972      Social History:     Social History   Tobacco Use   Smoking status: Former    Packs/day: 0.50    Years: 25.00    Total pack years: 12.50    Types: Cigarettes    Quit date: 12/31/2015    Years since quitting: 6.9   Smokeless tobacco: Never  Substance Use Topics   Alcohol use: Yes    Alcohol/week: 2.0 - 3.0 standard drinks of alcohol    Types: 1 - 2 Cans of beer, 1 Shots of liquor per week    Comment: Weekly         Family History :     Family History  Problem Relation Age of Onset   Kidney failure Mother    Hypertension Mother    Diabetes Sister    Epilepsy Brother    Kidney failure Brother 8   Cancer Paternal Aunt        type unknown   Diabetes Sister    Diabetes Sister    Epilepsy Son    Bipolar disorder Son    Colon cancer Neg Hx    Esophageal cancer Neg Hx    Inflammatory bowel disease Neg Hx    Liver disease Neg Hx    Pancreatic cancer Neg Hx    Rectal cancer Neg Hx    Stomach cancer Neg Hx    Colon polyps Neg Hx        Home Medications:   Prior to Admission medications   Medication Sig Start Date  End Date Taking? Authorizing Provider  acetaminophen (TYLENOL) 325 MG tablet Take 325 mg by mouth every 6 (six) hours as needed (pain).    [provider]  atorvastatin (LIPITOR) 20 MG tablet Take 20 mg by mouth daily.    [provider]  diclofenac Sodium (VOLTAREN) 1 % GEL Apply 1 application topically 3 (three) times daily as needed (left thumb pain).    [provider]  ferrous gluconate (FERGON) 324 MG tablet Take 1 tablet (324 mg total) by mouth daily with breakfast. 01/13/21   Mansouraty, Telford Nab., MD  hydrochlorothiazide (HYDRODIURIL) 25 MG tablet Take 25 mg by mouth daily.    [provider]  loratadine (CLARITIN) 10 MG tablet Take 10 mg by mouth daily.    [provider]  metFORMIN (GLUCOPHAGE) 500 MG tablet Take 500 mg by mouth 3 (three) times daily.    [provider]  omeprazole (PRILOSEC) 40 MG capsule Take 1 capsule (40 mg total) by mouth 2 (two) times daily. 03/04/21   Mansouraty, Telford Nab., MD  Polyvinyl Alcohol-Povidone PF 1.4-0.6 % SOLN Place 1 drop into both eyes in the morning, at noon, and at bedtime. 12/05/20   [provider]  senna-docusate (SENOKOT-S) 8.6-50 MG tablet Take 1 tablet by mouth daily.    [provider]  sildenafil (VIAGRA) 100 MG tablet  12/05/20   [provider]  simethicone (MYLICON) 80 MG chewable tablet Chew 80 mg by mouth at bedtime.    [provider]  Allergies:     Allergies  Allergen Reactions   Novocain [Procaine] Swelling    In the face   Penicillins Other (See Comments)    Childhood reaction     Physical Exam:   Vitals  Blood pressure (!) 112/59, pulse 88, temperature 97.8 F (36.6 C), temperature source Oral, resp. rate 12, height '5\' 9"'$  (1.753 m), weight 90.7 kg, SpO2 96 %.   1. General middle-aged moderately built African-American male lying in hospital bed in no apparent discomfort,  2. Normal affect and insight, Not Suicidal or  Homicidal, Awake Alert,   3. No F.N deficits, ALL C.Nerves Intact, Strength 5/5 all 4 extremities, Sensation intact all 4 extremities, Plantars down going.  4. Ears and Eyes appear Normal, Conjunctivae clear, PERRLA. Moist Oral Mucosa.  5. Supple Neck, No JVD, No cervical lymphadenopathy appriciated, No Carotid Bruits.  6. Symmetrical Chest wall movement, Good air movement bilaterally, CTAB.  7. RRR, No Gallops, Rubs or Murmurs, No Parasternal Heave.  8. Positive Bowel Sounds, Abdomen Soft, No tenderness, No organomegaly appriciated,No rebound -guarding or rigidity.  Stool negative for occult blood,  9.  No Cyanosis, Normal Skin Turgor, No Skin Rash or Bruise.  10. Good muscle tone,  joints appear normal , no effusions, Normal ROM.  11. No Palpable Lymph Nodes in Neck or Axillae      Data Review:   Recent Labs  Lab 12/22/22 1435  WBC 8.2  HGB 12.9*  HCT 40.4  PLT 282  MCV 80.5  MCH 25.7*  MCHC 31.9  RDW 16.2*  LYMPHSABS 1.9  MONOABS 0.9  EOSABS 0.1  BASOSABS 0.1    Recent Labs  Lab 12/22/22 1435  NA 138  K 3.4*  CL 100  CO2 20*  ANIONGAP 18*  GLUCOSE 161*  BUN 26*  CREATININE 2.93*  AST 27  ALT 16  ALKPHOS 74  BILITOT 0.6  ALBUMIN 4.0  CALCIUM 9.3      Recent Labs  Lab 12/22/22 1435  CALCIUM 9.3    Recent Labs  Lab 12/22/22 1435  WBC 8.2  PLT 282  CREATININE 2.93*    Urinalysis    Component Value Date/Time   COLORURINE YELLOW 11/23/2020 2359   APPEARANCEUR CLEAR 11/23/2020 2359   LABSPEC 1.016 11/23/2020 2359   PHURINE 5.0 11/23/2020 [place] 11/23/2020 [place] 11/23/2020 2359   BILIRUBINUR NEGATIVE 11/23/2020 [place] 11/23/2020 2359   PROTEINUR NEGATIVE 11/23/2020 2359   NITRITE NEGATIVE 11/23/2020 2359   LEUKOCYTESUR NEGATIVE 11/23/2020 2359      Imaging Results:    No results found.  My personal review of EKG: Rhythm NSR, no Acute ST changes   Assessment & Plan:    Syncope caused by dehydration due to diarrhea ongoing for the last 7 to 10 days but he has had intermittent episodes of diarrhea for several months, question if this is being caused by Glucophage, he also has history of diverticular bleed in the past but no signs suggestive of acute diverticulitis.  At this time his abdominal exam is benign, fecal occult blood is negative, his diarrhea could be caused by Glucophage which will be held, no exposure to antibiotics, no abdominal pain, no blood in stool or mucus in stool.  Will treat him with supportive care i.e. Imodium, holding of Glucophage, IV fluids, check TSH, monitor on a soft diet.  Patient if symptoms do not improve by tomorrow abdominal imaging and possible GI input or else  outpatient GI follow-up.  AKI and hypokalemia.  Caused by severe dehydration due to ongoing diarrhea as above.  Hydrate, replace potassium, check Ur. lytes, hold HCTZ and Glucophage, monitor.  Hypertension.  Blood pressure borderline.  As needed hydralazine.  Dyslipidemia.  Continue home dose statin.  Chronic anemia.  Supportive care.  Expect some fall due to heme dilution from IV fluids.  Gastritis with history of diverticular bleed.  No acute issues.  PPI with outpatient GI follow-up.  Chronic diastolic dysfunction with EF 60% on recent echocardiogram.  Compensated.    DM type II.  Hold Glucophage, check A1c, sliding scale.   DVT Prophylaxis Heparin    AM Labs Ordered, also please review Full Orders  Family Communication: Admission, patients condition and plan of care including tests being ordered have been discussed with the patient who indicates understanding and agree with the plan and Code Status.  Code Status Full  Likely DC to  Home  Condition Fair  Consults called: None    Admission status: Obs    Time spent in minutes : 35  Signature  -    Lala Lund M.D on 12/22/2022 at 4:56 PM   -  To page go to [email or web address]

## 2022-12-23 ENCOUNTER — Encounter (HOSPITAL_COMMUNITY): Payer: Self-pay | Admitting: Internal Medicine

## 2022-12-23 ENCOUNTER — Other Ambulatory Visit: Payer: Self-pay

## 2022-12-23 DIAGNOSIS — N179 Acute kidney failure, unspecified: Secondary | ICD-10-CM | POA: Diagnosis present

## 2022-12-23 DIAGNOSIS — R197 Diarrhea, unspecified: Secondary | ICD-10-CM | POA: Diagnosis not present

## 2022-12-23 DIAGNOSIS — Z87891 Personal history of nicotine dependence: Secondary | ICD-10-CM | POA: Diagnosis not present

## 2022-12-23 DIAGNOSIS — Z8249 Family history of ischemic heart disease and other diseases of the circulatory system: Secondary | ICD-10-CM | POA: Diagnosis not present

## 2022-12-23 DIAGNOSIS — E876 Hypokalemia: Secondary | ICD-10-CM | POA: Diagnosis present

## 2022-12-23 DIAGNOSIS — Z820 Family history of epilepsy and other diseases of the nervous system: Secondary | ICD-10-CM | POA: Diagnosis not present

## 2022-12-23 DIAGNOSIS — D649 Anemia, unspecified: Secondary | ICD-10-CM | POA: Diagnosis present

## 2022-12-23 DIAGNOSIS — I5032 Chronic diastolic (congestive) heart failure: Secondary | ICD-10-CM | POA: Diagnosis present

## 2022-12-23 DIAGNOSIS — R55 Syncope and collapse: Secondary | ICD-10-CM | POA: Diagnosis present

## 2022-12-23 DIAGNOSIS — Z7984 Long term (current) use of oral hypoglycemic drugs: Secondary | ICD-10-CM | POA: Diagnosis not present

## 2022-12-23 DIAGNOSIS — Z841 Family history of disorders of kidney and ureter: Secondary | ICD-10-CM | POA: Diagnosis not present

## 2022-12-23 DIAGNOSIS — Z79899 Other long term (current) drug therapy: Secondary | ICD-10-CM | POA: Diagnosis not present

## 2022-12-23 DIAGNOSIS — Z833 Family history of diabetes mellitus: Secondary | ICD-10-CM | POA: Diagnosis not present

## 2022-12-23 DIAGNOSIS — E119 Type 2 diabetes mellitus without complications: Secondary | ICD-10-CM | POA: Diagnosis present

## 2022-12-23 DIAGNOSIS — K582 Mixed irritable bowel syndrome: Secondary | ICD-10-CM | POA: Diagnosis present

## 2022-12-23 DIAGNOSIS — Z880 Allergy status to penicillin: Secondary | ICD-10-CM | POA: Diagnosis not present

## 2022-12-23 DIAGNOSIS — I110 Hypertensive heart disease with heart failure: Secondary | ICD-10-CM | POA: Diagnosis present

## 2022-12-23 DIAGNOSIS — R112 Nausea with vomiting, unspecified: Secondary | ICD-10-CM

## 2022-12-23 DIAGNOSIS — K219 Gastro-esophageal reflux disease without esophagitis: Secondary | ICD-10-CM | POA: Diagnosis present

## 2022-12-23 DIAGNOSIS — F419 Anxiety disorder, unspecified: Secondary | ICD-10-CM | POA: Diagnosis present

## 2022-12-23 DIAGNOSIS — E785 Hyperlipidemia, unspecified: Secondary | ICD-10-CM | POA: Diagnosis present

## 2022-12-23 DIAGNOSIS — Z888 Allergy status to other drugs, medicaments and biological substances status: Secondary | ICD-10-CM | POA: Diagnosis not present

## 2022-12-23 DIAGNOSIS — E860 Dehydration: Secondary | ICD-10-CM | POA: Diagnosis present

## 2022-12-23 LAB — CBC WITH DIFFERENTIAL/PLATELET
Abs Immature Granulocytes: 0.02 10*3/uL (ref 0.00–0.07)
Basophils Absolute: 0 10*3/uL (ref 0.0–0.1)
Basophils Relative: 1 %
Eosinophils Absolute: 0.1 10*3/uL (ref 0.0–0.5)
Eosinophils Relative: 2 %
HCT: 34.8 % — ABNORMAL LOW (ref 39.0–52.0)
Hemoglobin: 11.4 g/dL — ABNORMAL LOW (ref 13.0–17.0)
Immature Granulocytes: 0 %
Lymphocytes Relative: 24 %
Lymphs Abs: 1.5 10*3/uL (ref 0.7–4.0)
MCH: 26.1 pg (ref 26.0–34.0)
MCHC: 32.8 g/dL (ref 30.0–36.0)
MCV: 79.6 fL — ABNORMAL LOW (ref 80.0–100.0)
Monocytes Absolute: 0.8 10*3/uL (ref 0.1–1.0)
Monocytes Relative: 13 %
Neutro Abs: 3.9 10*3/uL (ref 1.7–7.7)
Neutrophils Relative %: 60 %
Platelets: 236 10*3/uL (ref 150–400)
RBC: 4.37 MIL/uL (ref 4.22–5.81)
RDW: 16.2 % — ABNORMAL HIGH (ref 11.5–15.5)
WBC: 6.4 10*3/uL (ref 4.0–10.5)
nRBC: 0 % (ref 0.0–0.2)

## 2022-12-23 LAB — BASIC METABOLIC PANEL
Anion gap: 15 (ref 5–15)
BUN: 28 mg/dL — ABNORMAL HIGH (ref 8–23)
CO2: 21 mmol/L — ABNORMAL LOW (ref 22–32)
Calcium: 9.1 mg/dL (ref 8.9–10.3)
Chloride: 101 mmol/L (ref 98–111)
Creatinine, Ser: 1.92 mg/dL — ABNORMAL HIGH (ref 0.61–1.24)
GFR, Estimated: 37 mL/min — ABNORMAL LOW (ref >60)
Glucose, Bld: 124 mg/dL — ABNORMAL HIGH (ref 70–99)
Potassium: 4.3 mmol/L (ref 3.5–5.1)
Sodium: 137 mmol/L (ref 135–145)

## 2022-12-23 LAB — GLUCOSE, CAPILLARY
Glucose-Capillary: 100 mg/dL — ABNORMAL HIGH (ref 70–99)
Glucose-Capillary: 168 mg/dL — ABNORMAL HIGH (ref 70–99)

## 2022-12-23 LAB — BRAIN NATRIURETIC PEPTIDE: B Natriuretic Peptide: 23.7 pg/mL (ref 0.0–100.0)

## 2022-12-23 LAB — CBG MONITORING, ED
Glucose-Capillary: 175 mg/dL — ABNORMAL HIGH (ref 70–99)
Glucose-Capillary: 105 mg/dL — ABNORMAL HIGH (ref 70–99)

## 2022-12-23 LAB — MAGNESIUM: Magnesium: 1.4 mg/dL — ABNORMAL LOW (ref 1.7–2.4)

## 2022-12-23 LAB — HEMOGLOBIN A1C
Hgb A1c MFr Bld: 7.3 % — ABNORMAL HIGH (ref 4.8–5.6)
Mean Plasma Glucose: 163 mg/dL

## 2022-12-23 LAB — OSMOLALITY: Osmolality: 292 mOsm/kg (ref 275–295)

## 2022-12-23 MED ORDER — LACTATED RINGERS IV SOLN: INTRAVENOUS | Status: DC

## 2022-12-23 MED ORDER — MAGNESIUM SULFATE 4 GM/100ML IV SOLN
4.0000 g | Freq: Once | INTRAVENOUS | Status: AC
  Administered 2022-12-23: 4 g via INTRAVENOUS
  Filled 2022-12-23: qty 100

## 2022-12-23 NOTE — Progress Notes (Signed)
   12/23/22 1432  Spiritual Encounters  Type of Visit Initial  Care provided to: Patient  Conversation partners present during encounter Nurse  Referral source Patient request  Reason for visit Advance directives  OnCall Visit No  Spiritual Framework  Presenting Themes Meaning/purpose/sources of inspiration  Community/Connection Family  Patient Stress Factors None identified  Family Stress Factors None identified  Goals  Self/Personal Goals "finaly" have advance directive in place   Visited with patient, discussed advance directive with patient who wants to proceed. Completed form with Patient, attempting to gather notary and witnesses.

## 2022-12-23 NOTE — ED Notes (Signed)
CBG taken just after pt finished eating, will recheck later

## 2022-12-23 NOTE — Progress Notes (Signed)
Physical Therapy Evaluation Patient Details Name: Jon Robinson. MRN: [id number] DOB: 1951-06-14 Today's Date: 12/23/2022  History of Present Illness  72 yo male with onset of diarrhea for last few days was noted to have dizziness and weakness, brought to hosp on 2/27.  Pt has dehydration, low K+ and AKI, resulting in his syncopal episode.  PMHx:  HTN, nephrolithiasis, GERD, DM, GI bleed, dyslipidemia, anemia,  Clinical Impression  Pt is in a living situation with assistance at times, although he is walking with no AD and moving with pain on R hip.  Recommend PT follow acutely but will not need home therapy to follow up as pain management is main intervention for his R hip with three point cane.  Pt is expecting to be discharged in a timely way, and will follow acutely to maintain mobility and continue to emphasize the importance of supported gait.  Goals for acute PT are outlined below.       Recommendations for follow up therapy are one component of a multi-disciplinary discharge planning process, led by the attending physician.  Recommendations may be updated based on patient status, additional functional criteria and insurance authorization.  Follow Up Recommendations No PT follow up      Assistance Recommended at Discharge PRN  Patient can return home with the following  Assistance with cooking/housework;Assist for transportation    Equipment Recommendations Kasandra Knudsen (interested in a hurry cane, three pronged)  Recommendations for Other Services       Functional Status Assessment Patient has not had a recent decline in their functional status     Precautions / Restrictions Precautions Precautions: Fall Precaution Comments: R hip chronic pain Restrictions Weight Bearing Restrictions: No      Mobility  Bed Mobility Overal bed mobility: Needs Assistance Bed Mobility: Supine to Sit, Sit to Supine     Supine to sit: Supervision Sit to supine: Supervision   General bed  mobility comments: took a bit of extra time and requires no physical assist    Transfers Overall transfer level: Modified independent Equipment used: None               General transfer comment: had IV pole near but does not require it    Ambulation/Gait Ambulation/Gait assistance: Supervision Gait Distance (Feet): 20 Feet Assistive device: IV Pole (for minor support) Gait Pattern/deviations: Step-through pattern, Decreased stride length, Decreased weight shift to right, Wide base of support Gait velocity: reduced Gait velocity interpretation: <1.31 ft/sec, indicative of household ambulator Pre-gait activities: standing BP check General Gait Details: pt used IV pole for minor support but can take steps with no AD.  Has increased pain on R hip to stand initially and to walk outside  [employer] Mobility    Modified Rankin (Stroke Patients Only)       Balance Overall balance assessment: History of Falls                                           Pertinent Vitals/Pain Pain Assessment Pain Assessment: Faces Faces Pain Scale: Hurts a little bit Pain Location: R hip Pain Descriptors / Indicators: Heaviness, Dull Pain Intervention(s): Limited activity within patient's tolerance, Monitored during session, Repositioned    Home Living Family/patient expects to be discharged to:: Assisted living  Home Equipment: Other (comment) (pt is not clear on what he has) Additional Comments: pt has a level environment, no AD used previously    Prior Function Prior Level of Function : Independent/Modified Independent             Mobility Comments: had been previously recommended to have a tripod cane (hurry cane) ADLs Comments: self care with I to shower daily     Hand Dominance   Dominant Hand: Right    Extremity/Trunk Assessment   Upper Extremity Assessment Upper Extremity Assessment: Generalized  weakness    Lower Extremity Assessment Lower Extremity Assessment: Generalized weakness    Cervical / Trunk Assessment Cervical / Trunk Assessment: Normal  Communication   Communication: No difficulties  Cognition Arousal/Alertness: Awake/alert Behavior During Therapy: Anxious, WFL for tasks assessed/performed Overall Cognitive Status: Within Functional Limits for tasks assessed                                 General Comments: describes anxiety and has been having this issue just last few days        General Comments General comments (skin integrity, edema, etc.): RA    Exercises     Assessment/Plan    PT Assessment Patient needs continued PT services  PT Problem List Decreased activity tolerance;Decreased mobility;Pain       PT Treatment Interventions DME instruction;Gait training;Functional mobility training;Therapeutic activities;Therapeutic exercise;Balance training;Neuromuscular re-education;Patient/family education    PT Goals (Current goals can be found in the Care Plan section)  Acute Rehab PT Goals Patient Stated Goal: to get home and get a cane to manage R hip pain PT Goal Formulation: With patient Time For Goal Achievement: 12/26/22 Potential to Achieve Goals: Good    Frequency Min 3X/week     Co-evaluation               AM-PAC PT "6 Clicks" Mobility  Outcome Measure Help needed turning from your back to your side while in a flat bed without using bedrails?: None Help needed moving from lying on your back to sitting on the side of a flat bed without using bedrails?: None Help needed moving to and from a bed to a chair (including a wheelchair)?: A Little Help needed standing up from a chair using your arms (e.g., wheelchair or bedside chair)?: A Little Help needed to walk in hospital room?: A Little Help needed climbing 3-5 steps with a railing? : A Lot 6 Click Score: 19    End of Session   Activity Tolerance: Patient  tolerated treatment well;Treatment limited secondary to medical complications (Comment) Patient left: in bed;with call bell/phone within reach Nurse Communication: Mobility status;Other (comment) (BP readings) PT Visit Diagnosis: Pain;Difficulty in walking, not elsewhere classified (R26.2);History of falling (Z91.81) Pain - Right/Left: Right Pain - part of body: Hip    Time: 1152-1223 PT Time Calculation (min) (ACUTE ONLY): 31 min   Charges:   PT Evaluation $PT Eval Moderate Complexity: 1 Mod PT Treatments $Gait Training: 8-22 mins       Ramond Dial 12/23/2022, 3:51 PM  Mee Hives, PT PhD Acute Rehab Dept. Number: [place] [facility]

## 2022-12-23 NOTE — ED Notes (Signed)
ED TO INPATIENT HANDOFF REPORT  ED Nurse Name and Phone #: cori [phone]  S Name/Age/Gender [place]. 72 y.o. male Room/Bed: 038C/038C  Code Status   Code Status: Full Code  Home/SNF/Other Home Patient oriented to: self, place, time, and situation Is this baseline? Yes   Triage Complete: Triage complete  Chief Complaint Syncope [R55]  Triage Note Patient arrived to the ED via EMS from outpatient orthotics provider. Patient states he lives in a transitions house. EMS states patient had a syncopal episode lasting 1 minute. Patient does not remember. Patient vomited and had feces on himself. EMS states patient was diaphoretic and had complaints of nausea. EMS administered 4 mg zofran and 150 mL NS. Patient not diaphoretic at this time. Patient confused at this time.   Allergies Allergies  Allergen Reactions   Novocain [Procaine] Swelling    In the face   Penicillins Other (See Comments)    Childhood reaction    Level of Care/Admitting Diagnosis ED Disposition     ED Disposition  Admit   Condition  --   [place]: [facility] [[id number]]  Level of Care: Telemetry Cardiac [103]  May admit patient to [facility] or [facility] if equivalent level of care is available:: No  Covid Evaluation: Asymptomatic - no recent exposure (last 10 days) testing not required  Diagnosis: Syncope [206001]  Admitting Physician: Octavio Graves  Attending Physician: Thurnell Lose [id number]  Certification:: I certify this patient will need inpatient services for at least 2 midnights  Estimated Length of Stay: 2          B Medical/Surgery History Past Medical History:  Diagnosis Date   Allergy    seasonal   Anxiety    Arthritis    Blood transfusion without reported diagnosis    Cataract    Colon polyps    Diabetes mellitus without complication ([place])    Diet controlled   Diverticulosis    GERD (gastroesophageal reflux disease)     Headache    History of kidney stones    Hypertension    Neuromuscular disorder ([place])    Finger tips numbness/tingling   Past Surgical History:  Procedure Laterality Date   COLONOSCOPY WITH PROPOFOL N/A 11/25/2020   Procedure: COLONOSCOPY WITH PROPOFOL;  Surgeon: Irving Copas., MD;  [facility];  Service: Gastroenterology;  Laterality: N/A;   FOOT SURGERY Left    x 2   KIDNEY STONE SURGERY  10/1997   KNEE ARTHROPLASTY Right 12/30/2017   Procedure: RIGHT TOTAL KNEE ARTHROPLASTY;  Surgeon: Rod Can, MD;  Location: [facility] ORS;  Service: Orthopedics;  Laterality: Right;  Needs RNFA   WRIST FRACTURE SURGERY Right 1972     A IV Location/Drains/Wounds Patient Lines/Drains/Airways Status     Active Line/Drains/Airways     Name Placement date Placement time Site Days   Peripheral IV 20 G Left Antecubital --  --  Antecubital  --   Peripheral IV 12/22/22 20 G Right Antecubital 12/22/22  1335  Antecubital  1   Peripheral IV 12/22/22 18 G Anterior;Left Forearm 12/22/22  1340  Forearm  1   Incision (Closed) 12/30/17 Knee Right 12/30/17  1506  -- 1819            Intake/Output Last 24 hours  Intake/Output Summary (Last 24 hours) at 12/23/2022 1133 Last data filed at 12/23/2022 0830 Gross per 24 hour  Intake 3141.52 ml  Output --  Net 3141.52 ml  Labs/Imaging Results for orders placed or performed during the hospital encounter of 12/22/22 (from the past 48 hour(s))  CBC with Differential     Status: Abnormal   Collection Time: 12/22/22  2:35 PM  Result Value Ref Range   WBC 8.2 4.0 - 10.5 K/uL   RBC 5.02 4.22 - 5.81 MIL/uL   Hemoglobin 12.9 (L) 13.0 - 17.0 g/dL   HCT 40.4 39.0 - 52.0 %   MCV 80.5 80.0 - 100.0 fL   MCH 25.7 (L) 26.0 - 34.0 pg   MCHC 31.9 30.0 - 36.0 g/dL   RDW 16.2 (H) 11.5 - 15.5 %   Platelets 282 150 - 400 K/uL   nRBC 0.0 0.0 - 0.2 %   Neutrophils Relative % 63 %   Neutro Abs 5.2 1.7 - 7.7 K/uL   Lymphocytes Relative 23 %   Lymphs  Abs 1.9 0.7 - 4.0 K/uL   Monocytes Relative 11 %   Monocytes Absolute 0.9 0.1 - 1.0 K/uL   Eosinophils Relative 1 %   Eosinophils Absolute 0.1 0.0 - 0.5 K/uL   Basophils Relative 1 %   Basophils Absolute 0.1 0.0 - 0.1 K/uL   Immature Granulocytes 1 %   Abs Immature Granulocytes 0.04 0.00 - 0.07 K/uL    Comment: Performed at [facility], [address], [place], [place] [address]  Comprehensive metabolic panel     Status: Abnormal   Collection Time: 12/22/22  2:35 PM  Result Value Ref Range   Sodium 138 135 - 145 mmol/L   Potassium 3.4 (L) 3.5 - 5.1 mmol/L   Chloride 100 98 - 111 mmol/L   CO2 20 (L) 22 - 32 mmol/L   Glucose, Bld 161 (H) 70 - 99 mg/dL    Comment: Glucose reference range applies only to samples taken after fasting for at least 8 hours.   BUN 26 (H) 8 - 23 mg/dL   Creatinine, Ser 2.93 (H) 0.61 - 1.24 mg/dL   Calcium 9.3 8.9 - 10.3 mg/dL   Total Protein 7.2 6.5 - 8.1 g/dL   Albumin 4.0 3.5 - 5.0 g/dL   AST 27 15 - 41 U/L   ALT 16 0 - 44 U/L   Alkaline Phosphatase 74 38 - 126 U/L   Total Bilirubin 0.6 0.3 - 1.2 mg/dL   GFR, Estimated 22 (L) >60 mL/min    Comment: (NOTE) Calculated using the CKD-EPI Creatinine Equation (2021)    Anion gap 18 (H) 5 - 15    Comment: Performed at [facility] [address], [place], [place] [address]  Troponin I (High Sensitivity)     Status: Abnormal   Collection Time: 12/22/22  2:35 PM  Result Value Ref Range   Troponin I (High Sensitivity) 28 (H) <18 ng/L    Comment: (NOTE) Elevated high sensitivity troponin I (hsTnI) values and significant  changes across serial measurements may suggest ACS but many other  chronic and acute conditions are known to elevate hsTnI results.  Refer to the "Links" section for chest pain algorithms and additional  guidance. Performed at [facility], [place] [address], [place], [place] [address]   Hemoglobin A1c     Status: Abnormal   Collection Time: 12/22/22  2:35 PM  Result  Value Ref Range   Hgb A1c MFr Bld 7.3 (H) 4.8 - 5.6 %    Comment: (NOTE)         Prediabetes: 5.7 - 6.4  Diabetes: >6.4         Glycemic control for adults with diabetes: <7.0    Mean Plasma Glucose 163 mg/dL    Comment: (NOTE) Performed At: [facility] [place], [place] [id number] Rush Farmer MD [id number]   POC occult blood, ED Provider will collect     Status: None   Collection Time: 12/22/22  3:00 PM  Result Value Ref Range   Fecal Occult Bld NEGATIVE NEGATIVE  Troponin I (High Sensitivity)     Status: Abnormal   Collection Time: 12/22/22  4:46 PM  Result Value Ref Range   Troponin I (High Sensitivity) 26 (H) <18 ng/L    Comment: (NOTE) Elevated high sensitivity troponin I (hsTnI) values and significant  changes across serial measurements may suggest ACS but many other  chronic and acute conditions are known to elevate hsTnI results.  Refer to the "Links" section for chest pain algorithms and additional  guidance. Performed at [facility], [place] [address] [place], [place] [address]   Creatinine, serum     Status: Abnormal   Collection Time: 12/22/22  4:46 PM  Result Value Ref Range   Creatinine, Ser 3.01 (H) 0.61 - 1.24 mg/dL   GFR, Estimated 22 (L) >60 mL/min    Comment: (NOTE) Calculated using the CKD-EPI Creatinine Equation (2021) Performed at [facility] [address] [place], [place] [address]   TSH     Status: None   Collection Time: 12/22/22  4:46 PM  Result Value Ref Range   TSH 1.748 0.350 - 4.500 uIU/mL    Comment: Performed by a 3rd Generation assay with a functional sensitivity of <=0.01 uIU/mL. Performed at [facility], [place] [address] [place], [place] [address]   CBG monitoring, ED     Status: Abnormal   Collection Time: 12/22/22  5:22 PM  Result Value Ref Range   Glucose-Capillary 132 (H) 70 - 99 mg/dL    Comment: Glucose reference range applies only to samples taken after fasting for at  least 8 hours.  Urinalysis, Routine w reflex microscopic -Urine, Clean Catch     Status: Abnormal   Collection Time: 12/22/22  5:30 PM  Result Value Ref Range   Color, Urine YELLOW YELLOW   APPearance CLEAR CLEAR   Specific Gravity, Urine >1.030 (H) 1.005 - 1.030   pH 5.5 5.0 - 8.0   Glucose, UA NEGATIVE NEGATIVE mg/dL   Hgb urine dipstick SMALL (A) NEGATIVE   Bilirubin Urine NEGATIVE NEGATIVE   Ketones, ur NEGATIVE NEGATIVE mg/dL   Protein, ur 100 (A) NEGATIVE mg/dL   Nitrite NEGATIVE NEGATIVE   Leukocytes,Ua TRACE (A) NEGATIVE    Comment: Performed at [facility] [address] [place], [place] [address]  Urinalysis, Microscopic (reflex)     Status: Abnormal   Collection Time: 12/22/22  5:30 PM  Result Value Ref Range   RBC / HPF 0-5 0 - 5 RBC/hpf   WBC, UA 0-5 0 - 5 WBC/hpf   Bacteria, UA RARE (A) NONE SEEN   Squamous Epithelial / HPF 0-5 0 - 5 /HPF   Mucus PRESENT    Hyaline Casts, UA PRESENT     Comment: Performed at [facility], [place] [address] [place], [place] [address]  CBG monitoring, ED     Status: Abnormal   Collection Time: 12/22/22 10:40 PM  Result Value Ref Range   Glucose-Capillary 184 (H) 70 - 99 mg/dL  Comment: Glucose reference range applies only to samples taken after fasting for at least 8 hours.  Brain natriuretic peptide     Status: None   Collection Time: 12/23/22  4:37 AM  Result Value Ref Range   B Natriuretic Peptide 23.7 0.0 - 100.0 pg/mL    Comment: Performed at [facility] [address], [place], [place] [id number]  Basic metabolic panel     Status: Abnormal   Collection Time: 12/23/22  4:37 AM  Result Value Ref Range   Sodium 137 135 - 145 mmol/L   Potassium 4.3 3.5 - 5.1 mmol/L    Comment: HEMOLYSIS AT THIS LEVEL MAY AFFECT RESULT DELTA CHECK NOTED    Chloride 101 98 - 111 mmol/L   CO2 21 (L) 22 - 32 mmol/L   Glucose, Bld 124 (H) 70 - 99 mg/dL    Comment: Glucose reference range applies only to samples taken after  fasting for at least 8 hours.   BUN 28 (H) 8 - 23 mg/dL   Creatinine, Ser 1.92 (H) 0.61 - 1.24 mg/dL   Calcium 9.1 8.9 - 10.3 mg/dL   GFR, Estimated 37 (L) >60 mL/min    Comment: (NOTE) Calculated using the CKD-EPI Creatinine Equation (2021)    Anion gap 15 5 - 15    Comment: Performed at [facility] [address] [place], [place] [address]  CBC with Differential/Platelet     Status: Abnormal   Collection Time: 12/23/22  4:37 AM  Result Value Ref Range   WBC 6.4 4.0 - 10.5 K/uL   RBC 4.37 4.22 - 5.81 MIL/uL   Hemoglobin 11.4 (L) 13.0 - 17.0 g/dL   HCT 34.8 (L) 39.0 - 52.0 %   MCV 79.6 (L) 80.0 - 100.0 fL   MCH 26.1 26.0 - 34.0 pg   MCHC 32.8 30.0 - 36.0 g/dL   RDW 16.2 (H) 11.5 - 15.5 %   Platelets 236 150 - 400 K/uL   nRBC 0.0 0.0 - 0.2 %   Neutrophils Relative % 60 %   Neutro Abs 3.9 1.7 - 7.7 K/uL   Lymphocytes Relative 24 %   Lymphs Abs 1.5 0.7 - 4.0 K/uL   Monocytes Relative 13 %   Monocytes Absolute 0.8 0.1 - 1.0 K/uL   Eosinophils Relative 2 %   Eosinophils Absolute 0.1 0.0 - 0.5 K/uL   Basophils Relative 1 %   Basophils Absolute 0.0 0.0 - 0.1 K/uL   Immature Granulocytes 0 %   Abs Immature Granulocytes 0.02 0.00 - 0.07 K/uL    Comment: Performed at [facility] [address] [place], [place] [address]  Magnesium     Status: Abnormal   Collection Time: 12/23/22  4:37 AM  Result Value Ref Range   Magnesium 1.4 (L) 1.7 - 2.4 mg/dL    Comment: Performed at [facility] [address], [place], [place] [address]  Osmolality     Status: None   Collection Time: 12/23/22  4:37 AM  Result Value Ref Range   Osmolality 292 275 - 295 mOsm/kg    Comment: Performed at [facility] [address], [place], [place] [address]  CBG monitoring, ED     Status: Abnormal   Collection Time: 12/23/22  8:14 AM  Result Value Ref Range   Glucose-Capillary 175 (H) 70 - 99 mg/dL    Comment: Glucose reference range applies only to samples taken after  fasting for at least 8  hours.   No results found.  Pending Labs Unresulted Labs (From admission, onward)     Start     Ordered   12/23/22 0539  Sodium, urine, random  Add-on,   AD        12/23/22 0538   12/23/22 0539  Creatinine, urine, random  Add-on,   AD        12/23/22 0538   12/23/22 0500  Brain natriuretic peptide  Daily,   R     Question:  Specimen collection method  Answer:  Lab=Lab collect   12/22/22 1640   12/23/22 [id number]  Basic metabolic panel  Daily,   R     Question:  Specimen collection method  Answer:  Lab=Lab collect   12/22/22 1640   12/23/22 0500  CBC with Differential/Platelet  Daily,   R     Question:  Specimen collection method  Answer:  Lab=Lab collect   12/22/22 1640   12/23/22 0500  Magnesium  Daily,   R     Question:  Specimen collection method  Answer:  Lab=Lab collect   12/22/22 1640   12/22/22 1729  Osmolality  Once,   R        12/22/22 1728   12/22/22 1729  Creatinine, urine, random  Once,   R        12/22/22 1728   12/22/22 1729  Sodium, urine, random  Once,   R        12/22/22 1728   12/22/22 1729  Osmolality, urine  Once,   R        12/22/22 1728   12/22/22 1729  Uric acid  Once,   R        12/22/22 1728   12/22/22 1729  Urea nitrogen, urine  Once,   R        12/22/22 1728            Vitals/Pain Today's Vitals   12/23/22 0900 12/23/22 0927 12/23/22 1100 12/23/22 1130  BP: (!) 168/72  (!) 140/96   Pulse:  89  83  Resp: '15 20 13 16  '$ Temp:      TempSrc:      SpO2:  100%  100%  Weight:      Height:      PainSc:        Isolation Precautions No active isolations  Medications Medications  atorvastatin (LIPITOR) tablet 20 mg (20 mg Oral Given 12/23/22 1023)  ferrous gluconate (FERGON) tablet 324 mg (324 mg Oral Given 12/23/22 1023)  pantoprazole (PROTONIX) EC tablet 40 mg (40 mg Oral Given 12/23/22 1023)  loperamide (IMODIUM) capsule 2 mg (has no administration in time range)  heparin injection 5,000 Units (5,000 Units Subcutaneous  Given 12/23/22 0606)  acetaminophen (TYLENOL) tablet 650 mg (has no administration in time range)    Or  acetaminophen (TYLENOL) suppository 650 mg (has no administration in time range)  ondansetron (ZOFRAN) tablet 4 mg (has no administration in time range)    Or  ondansetron (ZOFRAN) injection 4 mg (has no administration in time range)  insulin aspart (novoLOG) injection 0-9 Units ( Subcutaneous Patient Refused/Not Given 12/23/22 0948)  hydrALAZINE (APRESOLINE) injection 10 mg (has no administration in time range)  lactated ringers infusion ( Intravenous New Bag/Given 12/23/22 0546)  lactated ringers bolus 1,000 mL (0 mLs Intravenous Stopped 12/22/22 1634)  lactated ringers bolus 1,000 mL (0 mLs Intravenous Stopped 12/23/22 0600)  potassium chloride SA (KLOR-CON M) CR tablet 40 mEq (40 mEq Oral  Given 12/22/22 1641)  loperamide (IMODIUM) capsule 4 mg (4 mg Oral Given 12/22/22 1720)  magnesium sulfate IVPB 4 g 100 mL (0 g Intravenous Stopped 12/23/22 0830)    Mobility walks     Focused Assessments Cardiac Assessment Handoff:    No results found for: "CKTOTAL", "CKMB", "CKMBINDEX", "TROPONINI" No results found for: "DDIMER" Does the Patient currently have chest pain? No    R Recommendations: See Admitting Provider Note  Report given to:   Additional Notes:  2/27 patient sent to the ED from doctor appointment where he had a syncopal episode. Reports feeling dizzy and hot prior to episode, states he has had diarrhea x 2 weeks. No diarrhea since arrival. Patient denies c/o at this time.

## 2022-12-23 NOTE — Progress Notes (Signed)
   12/23/22 1551  Spiritual Encounters  Type of Visit Follow up  Care provided to: Patient  Reason for visit Advance directives   Returned to patient's room. Unable to ascertain notary at this time, volunteers leave at 4pm, patient also wants to talk with family, will try again tomorrow.

## 2022-12-23 NOTE — Progress Notes (Addendum)
PROGRESS NOTE                                                                                                                                                                                                             Patient Demographics:    Jon Robinson, is a 72 y.o. male, DOB - Dec 01, 1950, [id number]  Outpatient Primary MD for the patient is Donita Brooks, MD    LOS - 0  Admit date - 12/22/2022    Chief Complaint  Patient presents with   Loss of Consciousness       Brief Narrative (HPI from H&P)   72 y.o. male, with history of irritable bowel syndrome, diverticular bleed, gastritis, hypertension, dyslipidemia, osteoarthritis history of chronic nonspecific anemia, DM type II who has been having diarrhea for the last 1 week, he was feeling weak and dizzy for the last few days, today he was at an orthotic clinic to get a special shoe he became lightheaded and passed out was brought to the ER where he was diagnosed with dehydration, hypokalemia and AKI and admitted to the hospital.   Patient currently denies any headache, no fever or chills, no chest pain cough or shortness of breath, he has had no abdominal pain or discomfort, no dysuria, no blood in stool or urine, no mucus in stool, no recent exposure to antibiotics, he does take metformin which can cause diarrhea from time to time, denies exposure to any bad food items, no other close contacts have any diarrhea or gastroenteritis-like symptoms, denies any focal weakness.  He does have some generalized weakness which is improving since he has been started on IV fluids in the ER.   Subjective:    Jon Robinson today has, No headache, No chest pain, No abdominal pain - No Nausea, No new weakness tingling or numbness, no SOB, feels much better.   Assessment  & Plan :   Syncope caused by dehydration due to diarrhea ongoing for the last 7 to 10 days but he has had  intermittent episodes of diarrhea for several months, question if this is being caused by Glucophage, he also has history of diverticular bleed in the past but no signs suggestive of acute diverticulitis.  At this time his abdominal exam is benign, fecal occult blood is negative, his diarrhea could be caused by Glucophage which will  be held, no exposure to antibiotics, no abdominal pain, no blood in stool or mucus in stool.  Much improved with supportive care, 1 dose of Imodium, holding off of Glucophage, hydration with IV fluids.  Advance activity continue hydrating with IV fluids.  Likely discharge tomorrow.   AKI, hypomagnesemia and hypokalemia.  Caused by severe dehydration due to ongoing diarrhea as above.  Hydrate, replace potassium and magnesium, check Ur. Lytes (still not sent in ER, reordered but now could be misleading after adequate hydration), hold HCTZ and Glucophage, will function much improved, continue to monitor.   Hypertension.  Blood pressure borderline.  As needed hydralazine.   Dyslipidemia.  Continue home dose statin.   Chronic anemia.  Supportive care.  Expect some fall due to heme dilution from IV fluids.   Gastritis with history of diverticular bleed.  No acute issues.  PPI with outpatient GI follow-up.   Chronic diastolic dysfunction with EF 60% on recent echocardiogram.  Compensated.     DM type II.  Hold Glucophage, check A1c, sliding scale.    Lab Results  Component Value Date   HGBA1C 7.3 (H) 12/22/2022   CBG (last 3)  Recent Labs    12/22/22 1722 12/22/22 2240 12/23/22 0814  GLUCAP 132* 184* 175*         Condition - Fair  Family Communication  :  None present  Code Status :  Full  Consults  :  None  PUD Prophylaxis : PPI   Procedures  :            Disposition Plan  :    Status is: Observation   DVT Prophylaxis  :    heparin injection 5,000 Units Start: 12/22/22 2200   Lab Results  Component Value Date   PLT 236 12/23/2022     Diet :  Diet Order             DIET SOFT Fluid consistency: Thin  Diet effective now                    Inpatient Medications  Scheduled Meds:  atorvastatin  20 mg Oral Daily   ferrous gluconate  324 mg Oral Q breakfast   heparin  5,000 Units Subcutaneous Q8H   insulin aspart  0-9 Units Subcutaneous TID WC   pantoprazole  40 mg Oral Daily   Continuous Infusions:  lactated ringers 100 mL/hr at 12/23/22 0546   PRN Meds:.acetaminophen **OR** acetaminophen, hydrALAZINE, loperamide, ondansetron **OR** ondansetron (ZOFRAN) IV  Antibiotics  :    Anti-infectives (From admission, onward)    None         Objective:   Vitals:   12/23/22 0330 12/23/22 0334 12/23/22 0405 12/23/22 0621  BP: (!) 156/92 103/89 139/85 (!) 167/100  Pulse:  77 78 88  Resp: '19 16 16 16  '$ Temp:  (!) 97.5 F (36.4 C)    TempSrc:  Oral    SpO2:  100% 100% 97%  Weight:      Height:        Wt Readings from Last 3 Encounters:  12/22/22 90.7 kg  05/27/21 92.5 kg  03/04/21 98.4 kg     Intake/Output Summary (Last 24 hours) at 12/23/2022 0815 Last data filed at 12/23/2022 0600 Gross per 24 hour  Intake 3041.52 ml  Output --  Net 3041.52 ml     Physical Exam  Awake Alert, No new F.N deficits, Normal affect [place].AT,PERRAL Supple Neck, No JVD,   Symmetrical Chest wall  movement, Good air movement bilaterally, CTAB RRR,No Gallops,Rubs or new Murmurs,  +ve B.Sounds, Abd Soft, No tenderness,   No Cyanosis, Clubbing or edema     RN pressure injury documentation:      Data Review:    Recent Labs  Lab 12/22/22 1435 12/23/22 0437  WBC 8.2 6.4  HGB 12.9* 11.4*  HCT 40.4 34.8*  PLT 282 236  MCV 80.5 79.6*  MCH 25.7* 26.1  MCHC 31.9 32.8  RDW 16.2* 16.2*  LYMPHSABS 1.9 1.5  MONOABS 0.9 0.8  EOSABS 0.1 0.1  BASOSABS 0.1 0.0    Recent Labs  Lab 12/22/22 1435 12/22/22 1646 12/23/22 0437  NA 138  --  137  K 3.4*  --  4.3  CL 100  --  101  CO2 20*  --  21*  ANIONGAP 18*   --  15  GLUCOSE 161*  --  124*  BUN 26*  --  28*  CREATININE 2.93* 3.01* 1.92*  AST 27  --   --   ALT 16  --   --   ALKPHOS 74  --   --   BILITOT 0.6  --   --   ALBUMIN 4.0  --   --   TSH  --  1.748  --   HGBA1C 7.3*  --   --   BNP  --   --  23.7  MG  --   --  1.4*  CALCIUM 9.3  --  9.1      Recent Labs  Lab 12/22/22 1435 12/22/22 1646 12/23/22 0437  TSH  --  1.748  --   HGBA1C 7.3*  --   --   BNP  --   --  23.7  MG  --   --  1.4*  CALCIUM 9.3  --  9.1    Recent Labs  Lab 12/22/22 1435 12/22/22 1646 12/23/22 0437  WBC 8.2  --  6.4  PLT 282  --  236  CREATININE 2.93* 3.01* 1.92*    ------------------------------------------------------------------------------------------------------------------ No results for input(s): "CHOL", "HDL", "LDLCALC", "TRIG", "CHOLHDL", "LDLDIRECT" in the last 72 hours.  Lab Results  Component Value Date   HGBA1C 7.3 (H) 12/22/2022    Recent Labs    12/22/22 1646  TSH 1.748   ------------------------------------------------------------------------------------------------------------------ Cardiac Enzymes No results for input(s): "CKMB", "TROPONINI", "MYOGLOBIN" in the last 168 hours.  Invalid input(s): "CK"  Micro Results No results found for this or any previous visit (from the past 240 hour(s)).  Radiology Reports No results found.    Signature  -   Lala Lund M.D on 12/23/2022 at 8:15 AM   -  To page go to [email or web address]

## 2022-12-23 NOTE — Evaluation (Signed)
Occupational Therapy Evaluation Patient Details Name: Jon Robinson. MRN: [id number] DOB: Dec 05, 1950 Today's Date: 12/23/2022   History of Present Illness 72 yo male with onset of diarrhea for last few days was noted to have dizziness and weakness, brought to hosp on 2/27.  Pt has dehydration, low K+ and AKI, resulting in his syncopal episode.  PMHx:  HTN, nephrolithiasis, GERD, DM, GI bleed, dyslipidemia, anemia,   Clinical Impression   Patient evaluated by Occupational Therapy with no further acute OT needs identified. All education has been completed and the patient has no further questions. See below for any follow-up Occupational Therapy or equipment needs. OT to sign off. Thank you for referral.        Recommendations for follow up therapy are one component of a multi-disciplinary discharge planning process, led by the attending physician.  Recommendations may be updated based on patient status, additional functional criteria and insurance authorization.   Follow Up Recommendations  No OT follow up     Assistance Recommended at Discharge    Patient can return home with the following      Functional Status Assessment     Equipment Recommendations  None recommended by OT    Recommendations for Other Services       Precautions / Restrictions Precautions Precautions: Fall Precaution Comments: R hip chronic pain Restrictions Weight Bearing Restrictions: No      Mobility Bed Mobility Overal bed mobility: Modified Independent                  Transfers Overall transfer level: Modified independent                        Balance                                           ADL either performed or assessed with clinical judgement   ADL Overall ADL's : Modified independent                                             Vision Baseline Vision/History: 0 No visual deficits Patient Visual Report: No change from  baseline       Perception     Praxis      Pertinent Vitals/Pain Pain Assessment Pain Assessment: Faces Faces Pain Scale: Hurts a little bit Pain Location: R hip Pain Descriptors / Indicators: Discomfort     Hand Dominance Right   Extremity/Trunk Assessment Upper Extremity Assessment Upper Extremity Assessment: Generalized weakness   Lower Extremity Assessment Lower Extremity Assessment: Generalized weakness   Cervical / Trunk Assessment Cervical / Trunk Assessment: Normal   Communication Communication Communication: No difficulties   Cognition Arousal/Alertness: Awake/alert Behavior During Therapy: Anxious, WFL for tasks assessed/performed Overall Cognitive Status: Within Functional Limits for tasks assessed                                       General Comments  RA    Exercises     Shoulder Instructions      Home Living Family/patient expects to be discharged to:: Assisted living  Home Equipment: Other (comment) (pt is not clear on what he has)   Additional Comments: pt has a level environment, no AD used previously      Prior Functioning/Environment Prior Level of Function : Independent/Modified Independent             Mobility Comments: had been previously recommended to have a tripod cane (hurry cane) ADLs Comments: self care with I to shower daily        OT Problem List:        OT Treatment/Interventions:      OT Goals(Current goals can be found in the care plan section) Acute Rehab OT Goals Patient Stated Goal: to see granddaughter Potential to Achieve Goals: Good  OT Frequency:      Co-evaluation              AM-PAC OT "6 Clicks" Daily Activity     Outcome Measure Help from another person eating meals?: None Help from another person taking care of personal grooming?: None Help from another person toileting, which includes using toliet, bedpan, or urinal?: None Help  from another person bathing (including washing, rinsing, drying)?: None Help from another person to put on and taking off regular upper body clothing?: None Help from another person to put on and taking off regular lower body clothing?: None 6 Click Score: 24   End of Session Nurse Communication: Mobility status;Precautions  Activity Tolerance: Patient tolerated treatment well Patient left: in chair;with call bell/phone within reach  OT Visit Diagnosis: Unsteadiness on feet (R26.81)                Time: 1439-1450 OT Time Calculation (min): 11 min Charges:  OT General Charges $OT Visit: 1 Visit OT Evaluation $OT Eval Low Complexity: 1 Low   Brynn, OTR/L  Acute Rehabilitation Services Office: [phone] .   Jeri Modena 12/23/2022, 3:48 PM

## 2022-12-24 ENCOUNTER — Other Ambulatory Visit (HOSPITAL_COMMUNITY): Payer: Self-pay

## 2022-12-24 LAB — BASIC METABOLIC PANEL
Anion gap: 8 (ref 5–15)
BUN: 15 mg/dL (ref 8–23)
CO2: 29 mmol/L (ref 22–32)
Calcium: 9.1 mg/dL (ref 8.9–10.3)
Chloride: 100 mmol/L (ref 98–111)
Creatinine, Ser: 1.28 mg/dL — ABNORMAL HIGH (ref 0.61–1.24)
GFR, Estimated: >60 mL/min (ref >60)
Glucose, Bld: 131 mg/dL — ABNORMAL HIGH (ref 70–99)
Potassium: 3.6 mmol/L (ref 3.5–5.1)
Sodium: 137 mmol/L (ref 135–145)

## 2022-12-24 LAB — CBC WITH DIFFERENTIAL/PLATELET
Abs Immature Granulocytes: 0.02 10*3/uL (ref 0.00–0.07)
Basophils Absolute: 0 10*3/uL (ref 0.0–0.1)
Basophils Relative: 1 %
Eosinophils Absolute: 0.3 10*3/uL (ref 0.0–0.5)
Eosinophils Relative: 4 %
HCT: 34.9 % — ABNORMAL LOW (ref 39.0–52.0)
Hemoglobin: 11.5 g/dL — ABNORMAL LOW (ref 13.0–17.0)
Immature Granulocytes: 0 %
Lymphocytes Relative: 28 %
Lymphs Abs: 1.7 10*3/uL (ref 0.7–4.0)
MCH: 25.7 pg — ABNORMAL LOW (ref 26.0–34.0)
MCHC: 33 g/dL (ref 30.0–36.0)
MCV: 78.1 fL — ABNORMAL LOW (ref 80.0–100.0)
Monocytes Absolute: 0.8 10*3/uL (ref 0.1–1.0)
Monocytes Relative: 14 %
Neutro Abs: 3.3 10*3/uL (ref 1.7–7.7)
Neutrophils Relative %: 53 %
Platelets: 251 10*3/uL (ref 150–400)
RBC: 4.47 MIL/uL (ref 4.22–5.81)
RDW: 16 % — ABNORMAL HIGH (ref 11.5–15.5)
WBC: 6.1 10*3/uL (ref 4.0–10.5)
nRBC: 0 % (ref 0.0–0.2)

## 2022-12-24 LAB — GLUCOSE, CAPILLARY
Glucose-Capillary: 136 mg/dL — ABNORMAL HIGH (ref 70–99)
Glucose-Capillary: 169 mg/dL — ABNORMAL HIGH (ref 70–99)

## 2022-12-24 LAB — MAGNESIUM: Magnesium: 1.5 mg/dL — ABNORMAL LOW (ref 1.7–2.4)

## 2022-12-24 LAB — BRAIN NATRIURETIC PEPTIDE: B Natriuretic Peptide: 85.3 pg/mL (ref 0.0–100.0)

## 2022-12-24 MED ORDER — CARVEDILOL 6.25 MG PO TABS
6.2500 mg | ORAL_TABLET | Freq: Two times a day (BID) | ORAL | Status: DC
  Administered 2022-12-24: 6.25 mg via ORAL
  Filled 2022-12-24: qty 1

## 2022-12-24 MED ORDER — LOPERAMIDE HCL 2 MG PO CAPS
2.0000 mg | ORAL_CAPSULE | Freq: Four times a day (QID) | ORAL | 0 refills | Status: AC | PRN
  Filled 2022-12-24: qty 10, 3d supply, fill #0

## 2022-12-24 MED ORDER — LIVING WELL WITH DIABETES BOOK
Freq: Once | Status: AC
  Filled 2022-12-24: qty 1

## 2022-12-24 MED ORDER — CARVEDILOL 3.125 MG PO TABS: 3.1250 mg | ORAL_TABLET | Freq: Two times a day (BID) | ORAL | Status: DC

## 2022-12-24 MED ORDER — CARVEDILOL 6.25 MG PO TABS
6.2500 mg | ORAL_TABLET | Freq: Two times a day (BID) | ORAL | 0 refills | Status: AC
  Filled 2022-12-24: qty 60, 30d supply, fill #0

## 2022-12-24 MED ORDER — MAGNESIUM SULFATE 4 GM/100ML IV SOLN
4.0000 g | Freq: Once | INTRAVENOUS | Status: AC
  Administered 2022-12-24: 4 g via INTRAVENOUS
  Filled 2022-12-24: qty 100

## 2022-12-24 MED ORDER — CARVEDILOL 3.125 MG PO TABS
3.1250 mg | ORAL_TABLET | Freq: Two times a day (BID) | ORAL | 11 refills | Status: DC
  Filled 2022-12-24: qty 60, 30d supply, fill #0

## 2022-12-24 NOTE — Progress Notes (Signed)
Reviewed discharge instructions with PT. Verbalized understanding of blood sugar monitoring and follow up appointment with PCP. Denies pain. No s/s discharge.

## 2022-12-24 NOTE — Plan of Care (Signed)

## 2022-12-24 NOTE — Inpatient Diabetes Management (Signed)
Inpatient Diabetes Program Recommendations  AACE/ADA: New Consensus Statement on Inpatient Glycemic Control (2015)  Target Ranges:  Prepandial:   less than 140 mg/dL      Peak postprandial:   less than 180 mg/dL (1-2 hours)      Critically ill patients:  140 - 180 mg/dL   Lab Results  Component Value Date   GLUCAP 136 (H) 12/24/2022   HGBA1C 7.3 (H) 12/22/2022    Review of Glycemic Control  Diabetes history: type 2 Outpatient Diabetes medications: Glipizide 5 mg BID, Metformin 1000 mg BID Current orders for Inpatient glycemic control: Novolog 0-9 units correction scale TID  Inpatient Diabetes Program Recommendations:   Spoke with patient about his diabetes. States that he was diagnosed about 10 years ago. He has been trying to keep his blood sugars down over the years. He was on diet only at first, but then started on oral meds. States that his PCP is with the [facility] in [place]. Last seen by PCP in October. He lives in a group home since May, 2023, but is working toward getting his own place.   Discussed his HgbA1C of 7.3% which he was very pleased about. He even teared up when I gave him the results. He states that he has been working hard to get it down. He has had nutrition counseling in the past. Ordered Living Well with Diabetes booklet for him to take home with him. States that several people in his family have diabetes.   Harvel Ricks RN BSN CDE Diabetes Coordinator Pager: [phone]  8am-5pm

## 2022-12-24 NOTE — Discharge Instructions (Signed)
Follow with Primary MD Donita Brooks, MD in 7 days   Get CBC, CMP, Magnesium -  checked next visit with your primary MD    Activity: As tolerated with Full fall precautions use walker/cane & assistance as needed  Disposition Home   Diet: Heart Healthy - Low Carb  Accuchecks 4 times/day, Once in AM empty stomach and then before each meal. Log in all results and show them to your Prim.MD in 3 days. If any glucose reading is under 80 or above 300 call your Prim MD immidiately. Follow Low glucose instructions for glucose under 80 as instructed.   Special Instructions: If you have smoked or chewed Tobacco  in the last 2 yrs please stop smoking, stop any regular Alcohol  and or any Recreational drug use.  On your next visit with your primary care physician please Get Medicines reviewed and adjusted.  Please request your Prim.MD to go over all Hospital Tests and Procedure/Radiological results at the follow up, please get all Hospital records sent to your Prim MD by signing hospital release before you go home.  If you experience worsening of your admission symptoms, develop shortness of breath, life threatening emergency, suicidal or homicidal thoughts you must seek medical attention immediately by calling 911 or calling your MD immediately  if symptoms less severe.  You Must read complete instructions/literature along with all the possible adverse reactions/side effects for all the Medicines you take and that have been prescribed to you. Take any new Medicines after you have completely understood and accpet all the possible adverse reactions/side effects.

## 2022-12-24 NOTE — TOC Transition Note (Addendum)
Transition of Care [facility]) - CM/SW Discharge Note   Patient Details  Name: Jon Robinson. MRN: [id number] Date of Birth: 10/09/1951  [facility] CM/SW Contact:  Zenon Mayo, RN Phone Number: 12/24/2022, 8:58 AM   Clinical Narrative:    Patient is for dc today, he states he is waiting to hear from his room mate that he has a ride home.  He states he will let us know once that has been confirmed.  TOC to do medications.  He states he has a cane at home and he will check with the [facility] about getting three prong for him.  [facility], he goes to [place], CSW is [facility].        Patient Goals and CMS Choice      Discharge Placement                         Discharge Plan and Services Additional resources added to the After Visit Summary for                                       Social Determinants of Health (SDOH) Interventions SDOH Screenings   Food Insecurity: No Food Insecurity (12/23/2022)  Housing: Low Risk  (12/23/2022)  Transportation Needs: No Transportation Needs (12/23/2022)  Utilities: Not At Risk (12/23/2022)  Tobacco Use: Medium Risk (12/23/2022)     Readmission Risk Interventions     No data to display

## 2022-12-24 NOTE — TOC Transition Note (Signed)
Transition of Care [facility]) - CM/SW Discharge Note   Patient Details  Name: Jon Robinson. MRN: [id number] Date of Birth: [birth year]  [facility] CM/SW Contact:  Zenon Mayo, RN Phone Number: 12/24/2022, 8:55 AM   Clinical Narrative:    Patient is for dc today, he states his room mate is finding someone to transport him home, he will let us know once he has confirmation that he has transportation.          Patient Goals and CMS Choice      Discharge Placement                         Discharge Plan and Services Additional resources added to the After Visit Summary for                                       Social Determinants of Health (SDOH) Interventions SDOH Screenings   Food Insecurity: No Food Insecurity (12/23/2022)  Housing: Low Risk  (12/23/2022)  Transportation Needs: No Transportation Needs (12/23/2022)  Utilities: Not At Risk (12/23/2022)  Tobacco Use: Medium Risk (12/23/2022)     Readmission Risk Interventions     No data to display

## 2022-12-24 NOTE — Discharge Summary (Addendum)
Jon Robinson. [id number] DOB: 01-Jan-1951 DOA: 12/22/2022  PCP: Donita Brooks, MD  Admit date: 12/22/2022  Discharge date: 12/24/2022  Admitted From: Home   Disposition:  Home   Recommendations for Outpatient Follow-up:   Follow up with PCP in 1-2 weeks  PCP Please obtain BMP/CBC, 2 view CXR in 1week,  (see Discharge instructions)   PCP Please follow up on the following pending results: Check BMP, Magnesium, glycemic control closely.   Home Health: None   Equipment/Devices: None  Consultations: None  Discharge Condition: Stable    CODE STATUS: Full    Diet Recommendation: Heart Healthy Low Carb    Chief Complaint  Patient presents with   Loss of Consciousness     Brief history of present illness from the day of admission and additional interim summary    72 y.o. male, with history of irritable bowel syndrome, diverticular bleed, gastritis, hypertension, dyslipidemia, osteoarthritis history of chronic nonspecific anemia, DM type II who has been having diarrhea for the last 1 week, he was feeling weak and dizzy for the last few days, today he was at an orthotic clinic to get a special shoe he became lightheaded and passed out was brought to the ER where he was diagnosed with dehydration, hypokalemia and AKI and admitted to the hospital.   Patient currently denies any headache, no fever or chills, no chest pain cough or shortness of breath, he has had no abdominal pain or discomfort, no dysuria, no blood in stool or urine, no mucus in stool, no recent exposure to antibiotics, he does take metformin which can cause diarrhea from time to time, denies exposure to any bad food items, no other close contacts have any diarrhea or gastroenteritis-like symptoms, denies any focal weakness.  He does have some  generalized weakness which is improving since he has been started on IV fluids in the ER.                                                                 Hospital Course    Syncope caused by dehydration due to diarrhea ongoing for the last 7 to 10 days but he has had intermittent episodes of diarrhea for several months, question if this is being caused by Glucophage, he also has history of diverticular bleed in the past but no signs suggestive of acute diverticulitis.  At this time his abdominal exam is benign, fecal occult blood is negative, his diarrhea could be caused by Glucophage which was held, no exposure to antibiotics, no abdominal pain, no blood in stool or mucus in stool.  Much improved with supportive care, 1 dose of Imodium, holding off of Glucophage, now close to baseline after hydration with IV fluids, renal function has stabilized no further diarrhea or symptoms.  Will be discharged home with  close outpatient PCP follow-up.   AKI, hypomagnesemia and hypokalemia.  Caused by severe dehydration due to ongoing diarrhea as above.  Hydrate, replace potassium and magnesium, check Ur. Lytes (still not sent in ER, reordered but now could be misleading after adequate hydration), hold HCTZ and Glucophage, no function close to baseline, PCP to monitor post discharge.   Hypertension.  Blood pressure borderline, due to AKI ARB HCTZ on hold placed on Coreg instead PCP to monitor and adjust.   Dyslipidemia.  Continue home dose statin.   Chronic anemia.  Supportive care.  Expect some fall due to heme dilution from IV fluids.   Gastritis with history of diverticular bleed.  No acute issues.  PPI with outpatient GI follow-up.   Chronic diastolic dysfunction with EF 60% on recent echocardiogram.  Compensated.     DM type II.  Hold Glucophage, continue other home medications requested to check CBGs q. [facility] maintain a logbook and showed to PCP in 1 week for further adjustment.  Diabetic education prior  to discharge.  Lab Results  Component Value Date   HGBA1C 7.3 (H) 12/22/2022   Discharge diagnosis     Principal Problem:   Syncope Active Problems:   Cardiac arrhythmia   Irritable bowel syndrome with both constipation and diarrhea   History of GI diverticular bleed   Gastric ulcer without hemorrhage or perforation    Discharge instructions    Discharge Instructions     Discharge instructions   Complete by: As directed    Follow with Primary MD Donita Brooks, MD in 7 days   Get CBC, CMP, Magnesium -  checked next visit with your primary MD    Activity: As tolerated with Full fall precautions use walker/cane & assistance as needed  Disposition Home   Diet: Heart Healthy - Low Carb  Accuchecks 4 times/day, Once in AM empty stomach and then before each meal. Log in all results and show them to your Prim.MD in 3 days. If any glucose reading is under 80 or above 300 call your Prim MD immidiately. Follow Low glucose instructions for glucose under 80 as instructed.   Special Instructions: If you have smoked or chewed Tobacco  in the last 2 yrs please stop smoking, stop any regular Alcohol  and or any Recreational drug use.  On your next visit with your primary care physician please Get Medicines reviewed and adjusted.  Please request your Prim.MD to go over all Hospital Tests and Procedure/Radiological results at the follow up, please get all Hospital records sent to your Prim MD by signing hospital release before you go home.  If you experience worsening of your admission symptoms, develop shortness of breath, life threatening emergency, suicidal or homicidal thoughts you must seek medical attention immediately by calling 911 or calling your MD immediately  if symptoms less severe.  You Must read complete instructions/literature along with all the possible adverse reactions/side effects for all the Medicines you take and that have been prescribed to you. Take any new  Medicines after you have completely understood and accpet all the possible adverse reactions/side effects.   Increase activity slowly   Complete by: As directed        Discharge Medications   Allergies as of 12/24/2022       Reactions   Novocain [procaine] Swelling   Facial swelling   Penicillins Other (See Comments)   Childhood allergy  Unknown reaction        Medication List  STOP taking these medications    hydrochlorothiazide 25 MG tablet Commonly known as: HYDRODIURIL   losartan 100 MG tablet Commonly known as: COZAAR   metFORMIN 1000 MG tablet Commonly known as: GLUCOPHAGE       TAKE these medications    acetaminophen 500 MG tablet Commonly known as: TYLENOL Take 1,000 mg by mouth daily as needed for mild pain or moderate pain.   atorvastatin 20 MG tablet Commonly known as: LIPITOR Take 20 mg by mouth at bedtime.   carboxymethylcellulose 0.5 % Soln Commonly known as: REFRESH PLUS Place 1 drop into both eyes 4 (four) times daily as needed (dry eyes).   carvedilol 6.25 MG tablet Commonly known as: COREG Take 1 tablet (6.25 mg total) by mouth 2 (two) times daily with a meal.   cetirizine 10 MG tablet Commonly known as: ZYRTEC Take 10 mg by mouth daily as needed for allergies.   diclofenac Sodium 1 % Gel Commonly known as: VOLTAREN Apply 1 application topically 3 (three) times daily as needed (left thumb pain).   ergocalciferol 1.25 MG (50000 UT) capsule Commonly known as: VITAMIN D2 Take 50,000 Units by mouth See admin instructions. 50000 units every Tuesday.   ferrous sulfate 325 (65 FE) MG tablet Take 325 mg by mouth daily.   Flintstones Multivitamin Chew Chew 1 tablet by mouth daily.   glipiZIDE 5 MG tablet Commonly known as: GLUCOTROL Take 5 mg by mouth 2 (two) times daily before a meal.   loperamide 2 MG capsule Commonly known as: IMODIUM Take 1 capsule (2 mg total) by mouth every 6 (six) hours as needed for diarrhea or loose  stools.   omeprazole 40 MG capsule Commonly known as: PRILOSEC Take 1 capsule (40 mg total) by mouth 2 (two) times daily.   sildenafil 100 MG tablet Commonly known as: VIAGRA Take 100 mg by mouth daily as needed for erectile dysfunction.         Follow-up Information     Donita Brooks, MD. Schedule an appointment as soon as possible for a visit in 1 week(s).   Specialty: Internal Medicine Contact information: [place] [address] [phone]                 Major procedures and Radiology Reports - PLEASE review detailed and final reports thoroughly  -     No results found.  Micro Results    No results found for this or any previous visit (from the past 240 hour(s)).  Today   Subjective    Jon Robinson today has no headache,no chest abdominal pain,no new weakness tingling or numbness, feels much better wants to go home today.    Objective   Blood pressure 145/85, pulse 96, temperature 98.9 F (37.2 C), temperature source Oral, resp. rate 19, height '5\' 9"'$  (1.753 m), weight 90.7 kg, SpO2 96 %.   Intake/Output Summary (Last 24 hours) at 12/24/2022 0905 Last data filed at 12/24/2022 [id number] Gross per 24 hour  Intake 600 ml  Output 800 ml  Net -200 ml    Exam  Awake Alert, No new F.N deficits,    [place].AT,PERRAL Supple Neck,   Symmetrical Chest wall movement, Good air movement bilaterally, CTAB RRR,No Gallops,   +ve B.Sounds, Abd Soft, Non tender,  No Cyanosis, Clubbing or edema    Data Review   Recent Labs  Lab 12/22/22 1435 12/23/22 0437 12/24/22 0042  WBC 8.2 6.4 6.1  HGB 12.9* 11.4* 11.5*  HCT 40.4 34.8* 34.9*  PLT 282 236 251  MCV 80.5 79.6* 78.1*  MCH 25.7* 26.1 25.7*  MCHC 31.9 32.8 33.0  RDW 16.2* 16.2* 16.0*  LYMPHSABS 1.9 1.5 1.7  MONOABS 0.9 0.8 0.8  EOSABS 0.1 0.1 0.3  BASOSABS 0.1 0.0 0.0    Recent Labs  Lab 12/22/22 1435 12/22/22 1646 12/23/22 0437 12/24/22 0042  NA 138  --   137 137  K 3.4*  --  4.3 3.6  CL 100  --  101 100  CO2 20*  --  21* 29  ANIONGAP 18*  --  15 8  GLUCOSE 161*  --  124* 131*  BUN 26*  --  28* 15  CREATININE 2.93* 3.01* 1.92* 1.28*  AST 27  --   --   --   ALT 16  --   --   --   ALKPHOS 74  --   --   --   BILITOT 0.6  --   --   --   ALBUMIN 4.0  --   --   --   TSH  --  1.748  --   --   HGBA1C 7.3*  --   --   --   BNP  --   --  23.7 85.3  MG  --   --  1.4* 1.5*  CALCIUM 9.3  --  9.1 9.1     Total Time in preparing paper work, data evaluation and todays exam - 35 minutes  Signature  -    Lala Lund M.D on 12/24/2022 at 9:05 AM   -  To page go to [email or web address]
